# Patient Record
Sex: Male | Born: 1970 | Race: White | Hispanic: No | Marital: Married | Smoking: Former smoker
Health system: Southern US, Community
[De-identification: ages and names within clinical notes are randomized; demographics above are authoritative.]

## PROBLEM LIST (undated history)

## (undated) DIAGNOSIS — F32A Depression, unspecified: Secondary | ICD-10-CM

## (undated) DIAGNOSIS — F419 Anxiety disorder, unspecified: Secondary | ICD-10-CM

## (undated) DIAGNOSIS — I10 Essential (primary) hypertension: Secondary | ICD-10-CM

## (undated) DIAGNOSIS — F4310 Post-traumatic stress disorder, unspecified: Secondary | ICD-10-CM

## (undated) HISTORY — PX: SHOULDER SURGERY: SHX246

## (undated) HISTORY — PX: HAND ARTHROPLASTY: SHX968

---

## 2005-08-24 ENCOUNTER — Emergency Department (HOSPITAL_COMMUNITY): Admission: EM | Admit: 2005-08-24 | Discharge: 2005-08-24 | Payer: Self-pay | Admitting: Emergency Medicine

## 2007-02-15 ENCOUNTER — Emergency Department (HOSPITAL_COMMUNITY): Admission: EM | Admit: 2007-02-15 | Discharge: 2007-02-15 | Payer: Self-pay | Admitting: Emergency Medicine

## 2014-06-21 ENCOUNTER — Emergency Department (HOSPITAL_COMMUNITY)
Admission: EM | Admit: 2014-06-21 | Discharge: 2014-06-21 | Disposition: A | Discharge status: Home / Self Care | Attending: Emergency Medicine | Admitting: Emergency Medicine

## 2014-06-21 ENCOUNTER — Emergency Department (HOSPITAL_COMMUNITY)

## 2014-06-21 ENCOUNTER — Encounter (HOSPITAL_COMMUNITY): Payer: Self-pay | Admitting: Emergency Medicine

## 2014-06-21 DIAGNOSIS — I10 Essential (primary) hypertension: Secondary | ICD-10-CM | POA: Insufficient documentation

## 2014-06-21 DIAGNOSIS — Y9289 Other specified places as the place of occurrence of the external cause: Secondary | ICD-10-CM | POA: Insufficient documentation

## 2014-06-21 DIAGNOSIS — Y998 Other external cause status: Secondary | ICD-10-CM | POA: Diagnosis not present

## 2014-06-21 DIAGNOSIS — Z87891 Personal history of nicotine dependence: Secondary | ICD-10-CM | POA: Diagnosis not present

## 2014-06-21 DIAGNOSIS — Z79899 Other long term (current) drug therapy: Secondary | ICD-10-CM | POA: Diagnosis not present

## 2014-06-21 DIAGNOSIS — W010XXA Fall on same level from slipping, tripping and stumbling without subsequent striking against object, initial encounter: Secondary | ICD-10-CM | POA: Insufficient documentation

## 2014-06-21 DIAGNOSIS — Y9389 Activity, other specified: Secondary | ICD-10-CM | POA: Diagnosis not present

## 2014-06-21 DIAGNOSIS — S4992XA Unspecified injury of left shoulder and upper arm, initial encounter: Secondary | ICD-10-CM | POA: Diagnosis present

## 2014-06-21 DIAGNOSIS — W19XXXA Unspecified fall, initial encounter: Secondary | ICD-10-CM

## 2014-06-21 DIAGNOSIS — S42145A Nondisplaced fracture of glenoid cavity of scapula, left shoulder, initial encounter for closed fracture: Secondary | ICD-10-CM | POA: Insufficient documentation

## 2014-06-21 DIAGNOSIS — S42142A Displaced fracture of glenoid cavity of scapula, left shoulder, initial encounter for closed fracture: Secondary | ICD-10-CM

## 2014-06-21 DIAGNOSIS — S42152A Displaced fracture of neck of scapula, left shoulder, initial encounter for closed fracture: Secondary | ICD-10-CM

## 2014-06-21 HISTORY — DX: Essential (primary) hypertension: I10

## 2014-06-21 MED ORDER — OXYCODONE-ACETAMINOPHEN 5-325 MG PO TABS
2.0000 | ORAL_TABLET | Freq: Once | ORAL | Status: AC
Start: 2014-06-21 — End: 2014-06-21
  Administered 2014-06-21: 2 via ORAL
  Filled 2014-06-21: qty 2

## 2014-06-21 MED ORDER — OXYCODONE-ACETAMINOPHEN 5-325 MG PO TABS: 1.0000 | ORAL_TABLET | ORAL | Status: DC | PRN

## 2014-06-21 NOTE — ED Notes (Signed)
Pt reports falling yesterday and landing on his L side. Pt states his L shoulder "keeps dislocating." No hx of same.

## 2014-06-21 NOTE — Discharge Instructions (Signed)
Wear the shoulder immobilizer at all times except when bathing and changing clothing. Be very careful when you remove the immobilizer to prevent shoulder from dislocating. Follow-up with the orthopedist of your choice as soon as possible for further treatment.     Shoulder Fracture You have a fractured humerus (bone in the upper arm) at the shoulder just below the ball of the shoulder joint. Most of the time the bones of a broken shoulder are in an acceptable position. Usually the injury can be treated with a shoulder immobilizer or sling and swath bandage. These devices support the arm and prevent any shoulder movement. If the bones are not in a good position, then surgery is sometimes needed. Shoulder fractures usually cause swelling, pain, and discoloration around the upper arm initially. They heal in 8-12 weeks with proper treatment. Rest in bed or a reclining chair as long as your shoulder is very painful. Sitting up generally results in less pain at the fracture site. Do not remove your shoulder bandage until your caregiver approves. You may apply ice packs over the shoulder for 20-30 minutes every 2 hours for the next 2-3 days to reduce the pain and swelling. Use your pain medicine as prescribed.  SEEK IMMEDIATE MEDICAL CARE IF:  You develop severe shoulder pain unrelieved by rest and taking pain medicine.  You have pain, numbness, tingling, or weakness in the hand or wrist.  You develop shortness of breath, chest pain, severe weakness, or fainting.  You have severe pain with motion of the fingers or wrist. MAKE SURE YOU:   Understand these instructions.  Will watch your condition.  Will get help right away if you are not doing well or get worse. Document Released: 07/25/2004 Document Revised: 09/09/2011 Document Reviewed: 10/05/2008 [facility] Patient Information 2015 [employer] [place]. This information is not intended to replace advice given to you by your health care provider. Make  sure you discuss any questions you have with your health care provider.

## 2014-06-21 NOTE — ED Notes (Signed)
Patient given discharge instruction, verbalized understand. Patient ambulatory out of the department.  

## 2014-06-21 NOTE — ED Provider Notes (Signed)
CSN: [id number]     Arrival date & time 06/21/14  1847 History  This chart was scribed for Flint MelterElliott L Becci Batty, MD by Murriel HopperAlec Bankhead, ED Scribe. This patient was seen in room APA11/APA11 and the patient's care was started at 8:32 PM.     Chief Complaint  Patient presents with  . Fall    The history is provided by the patient. No language interpreter was used.     HPI Comments: Jerome CheshireDavid Shawn Lopez is a [age] y.o. male who presents to the Emergency Department complaining of constant left shoulder pain that began yesterday after a fall. Pt states that he fell and landed on his left side, and since complains that his shoulder feels like it keeps dislocating. Pt does not currently work, and denies any previous left shoulder problems. Pt denies any other medical problems, and states that he currently takes two prescription medications.      Past Medical History  Diagnosis Date  . Hypertension    Past Surgical History  Procedure Laterality Date  . Hand arthroplasty     History reviewed. No pertinent family history. History  Substance Use Topics  . Smoking status: Former Games developermoker  . Smokeless tobacco: Current User  . Alcohol Use: Yes     Comment: occas    Review of Systems  Musculoskeletal: Positive for arthralgias.  All other systems reviewed and are negative.     Allergies  Review of patient's allergies indicates not on file.  Home Medications   Prior to Admission medications   Medication Sig Start Date End Date Taking? Authorizing Provider  DULoxetine (CYMBALTA) 60 MG capsule Take 60 mg by mouth daily. 05/31/14  Yes Historical Provider, MD  gabapentin (NEURONTIN) 300 MG capsule Take 300 mg by mouth 3 (three) times daily. 04/26/14  Yes Historical Provider, MD  Multiple Vitamins-Minerals (GNP MENS MULTIPLUS PO) Take 1 packet by mouth daily.   Yes Historical Provider, MD  oxyCODONE-acetaminophen (PERCOCET) 5-325 MG per tablet Take 1 tablet by mouth every 4 (four) hours as needed for  severe pain. 06/21/14   Flint MelterElliott L Khristian Seals, MD  oxyCODONE-acetaminophen (PERCOCET/ROXICET) 5-325 MG per tablet Take 1 tablet by mouth every 4 (four) hours as needed. 06/21/14   Flint MelterElliott L Nandita Mathenia, MD   BP 184/109 mmHg  Pulse 92  Temp(Src) 98.9 F (37.2 C) (Oral)  Resp 16  Ht 5\' 11"  (1.803 m)  Wt 174 lb (78.926 kg)  BMI 24.28 kg/m2  SpO2 99% Physical Exam  Constitutional: He is oriented to person, place, and time. He appears well-developed and well-nourished.  HENT:  Head: Normocephalic and atraumatic.  Right Ear: External ear normal.  Left Ear: External ear normal.  Eyes: Conjunctivae and EOM are normal. Pupils are equal, round, and reactive to light.  Neck: Normal range of motion and phonation normal. Neck supple.  Cardiovascular: Normal rate, regular rhythm and normal heart sounds.   Pulmonary/Chest: Effort normal and breath sounds normal. He exhibits no bony tenderness.  Abdominal: Soft. There is no tenderness.  Musculoskeletal: Normal range of motion.  Neurological: He is alert and oriented to person, place, and time. No cranial nerve deficit or sensory deficit. He exhibits normal muscle tone. Coordination normal.  Skin: Skin is warm, dry and intact.  Psychiatric: He has a normal mood and affect. His behavior is normal. Judgment and thought content normal.  Nursing note and vitals reviewed.   ED Course  Procedures (including critical care time)  DIAGNOSTIC STUDIES: Oxygen Saturation is 99% on RA, normal by  my interpretation.    COORDINATION OF CARE: 8:39 PM Discussed treatment plan with pt at bedside and pt agreed to plan.    Labs Review Labs Reviewed - No data to display  Imaging Review Dg Shoulder Left  06/21/2014   CLINICAL DATA:  Fall last night  EXAM: LEFT SHOULDER - 2+ VIEW  COMPARISON:  None.  FINDINGS: There is cortical step-off an a suspected bone fragment at the inferior glenoid. Humerus and clavicle are intact. No glenohumeral dislocation.  IMPRESSION: Findings  are worrisome for an acute fracture involving the inferior lip of the glenoid. CT may be helpful.   Electronically Signed   By: Maryclare BeanArt  Hoss M.D.   On: 06/21/2014 19:43     EKG Interpretation None      MDM   Final diagnoses:  Glenoid fracture of shoulder, left, closed, initial encounter     Unstable Glenoid fracture, humerus in place on imaging. No nerve injury. Injury is subacute  Nursing Notes Reviewed/ Care Coordinated Applicable Imaging Reviewed Interpretation of Laboratory Data incorporated into ED treatment  The patient appears reasonably screened and/or stabilized for discharge and I doubt any other medical condition or other [facility] requiring further screening, evaluation, or treatment in the ED at this time prior to discharge.  Plan: Home Medications- Percocet; Home Treatments- Shoulder immobilizer continuously; return here if the recommended treatment, does not improve the symptoms; Recommended follow up- Ortho f/u asap  I personally performed the services described in this documentation, which was scribed in my presence. The recorded information has been reviewed and is accurate.      Flint MelterElliott L Amarian Botero, MD 06/23/14 1255

## 2014-06-21 NOTE — ED Notes (Signed)
Should immobilizer applied

## 2014-06-27 MED FILL — Oxycodone w/ Acetaminophen Tab 5-325 MG: ORAL | Qty: 6 | Status: AC

## 2018-12-31 ENCOUNTER — Encounter: Payer: Self-pay | Admitting: Internal Medicine

## 2020-07-21 ENCOUNTER — Emergency Department (HOSPITAL_COMMUNITY)

## 2020-07-21 ENCOUNTER — Other Ambulatory Visit: Payer: Self-pay

## 2020-07-21 ENCOUNTER — Encounter (HOSPITAL_COMMUNITY): Payer: Self-pay | Admitting: Emergency Medicine

## 2020-07-21 ENCOUNTER — Emergency Department (HOSPITAL_COMMUNITY)
Admission: EM | Admit: 2020-07-21 | Discharge: 2020-07-21 | Disposition: A | Discharge status: Home / Self Care | Attending: Emergency Medicine | Admitting: Emergency Medicine

## 2020-07-21 DIAGNOSIS — S0083XA Contusion of other part of head, initial encounter: Secondary | ICD-10-CM | POA: Diagnosis not present

## 2020-07-21 DIAGNOSIS — I10 Essential (primary) hypertension: Secondary | ICD-10-CM | POA: Insufficient documentation

## 2020-07-21 DIAGNOSIS — R000 Tachycardia, unspecified: Secondary | ICD-10-CM | POA: Diagnosis not present

## 2020-07-21 DIAGNOSIS — W108XXA Fall (on) (from) other stairs and steps, initial encounter: Secondary | ICD-10-CM | POA: Diagnosis not present

## 2020-07-21 DIAGNOSIS — R55 Syncope and collapse: Secondary | ICD-10-CM | POA: Diagnosis not present

## 2020-07-21 DIAGNOSIS — S0990XA Unspecified injury of head, initial encounter: Secondary | ICD-10-CM | POA: Diagnosis present

## 2020-07-21 DIAGNOSIS — Z79899 Other long term (current) drug therapy: Secondary | ICD-10-CM | POA: Diagnosis not present

## 2020-07-21 DIAGNOSIS — Z87891 Personal history of nicotine dependence: Secondary | ICD-10-CM | POA: Insufficient documentation

## 2020-07-21 HISTORY — DX: Post-traumatic stress disorder, unspecified: F43.10

## 2020-07-21 HISTORY — DX: Depression, unspecified: F32.A

## 2020-07-21 HISTORY — DX: Anxiety disorder, unspecified: F41.9

## 2020-07-21 LAB — URINALYSIS, ROUTINE W REFLEX MICROSCOPIC
Bilirubin Urine: NEGATIVE
Glucose, UA: NEGATIVE mg/dL
Hgb urine dipstick: NEGATIVE
Ketones, ur: NEGATIVE mg/dL
Leukocytes,Ua: NEGATIVE
Nitrite: NEGATIVE
Protein, ur: NEGATIVE mg/dL
Specific Gravity, Urine: 1.012 (ref 1.005–1.030)
pH: 6 (ref 5.0–8.0)

## 2020-07-21 LAB — CBC
HCT: 43.4 % (ref 39.0–52.0)
Hemoglobin: 15.2 g/dL (ref 13.0–17.0)
MCH: 31.6 pg (ref 26.0–34.0)
MCHC: 35 g/dL (ref 30.0–36.0)
MCV: 90.2 fL (ref 80.0–100.0)
Platelets: 450 10*3/uL — ABNORMAL HIGH (ref 150–400)
RBC: 4.81 MIL/uL (ref 4.22–5.81)
RDW: 13.5 % (ref 11.5–15.5)
WBC: 11.8 10*3/uL — ABNORMAL HIGH (ref 4.0–10.5)
nRBC: 0 % (ref 0.0–0.2)

## 2020-07-21 LAB — BASIC METABOLIC PANEL
Anion gap: 11 (ref 5–15)
BUN: 8 mg/dL (ref 6–20)
CO2: 28 mmol/L (ref 22–32)
Calcium: 9.1 mg/dL (ref 8.9–10.3)
Chloride: 89 mmol/L — ABNORMAL LOW (ref 98–111)
Creatinine, Ser: 0.96 mg/dL (ref 0.61–1.24)
GFR, Estimated: >60 mL/min (ref >60)
Glucose, Bld: 88 mg/dL (ref 70–99)
Potassium: 3.5 mmol/L (ref 3.5–5.1)
Sodium: 128 mmol/L — ABNORMAL LOW (ref 135–145)

## 2020-07-21 NOTE — ED Triage Notes (Signed)
Pt to the ED with c/o a fall down the stairs on his back this morning.   Pt states later he passed out and hit his nose on the dining table. Pt does not know how long he was out, but denies taking blood thinners.

## 2020-07-21 NOTE — ED Provider Notes (Signed)
[facility] EMERGENCY DEPARTMENT Provider Note  CSN: [id number] Arrival date & time: 07/21/20 1256    History Chief Complaint  Patient presents with  . Loss of Consciousness    HPI  Jerome Lopez is a 50 y.o. male with history of anxiety and PTSD reports he slipped and fell down his basement stairs around 5am today, landing on his bottom and his back. Does not think he hit his head. He reports severe pain to L posterior ribs, C, T and L spines but did not seek medical care at that time. He states he then went to play some video games for a while and a few hours later when he got up to use the restroom he lost consciousness briefly, woke up just in time to see himself hit his head on the dining room table. He did not have LOC after the head injury. He then came to the ED for evaluation. He has a history of chronic pain, states he takes Oxycontin 'as needed' but did not take any today.    Past Medical History:  Diagnosis Date  . Anxiety   . Depression   . Hypertension   . PTSD (post-traumatic stress disorder)     Past Surgical History:  Procedure Laterality Date  . HAND ARTHROPLASTY    . SHOULDER SURGERY Left     History reviewed. No pertinent family history.  Social History   Tobacco Use  . Smoking status: Former Games developermoker  . Smokeless tobacco: Current User  Vaping Use  . Vaping Use: Never used  Substance Use Topics  . Alcohol use: Yes    Alcohol/week: 10.0 standard drinks    Types: 5 Cans of beer, 5 Shots of liquor per week    Comment: occas  . Drug use: No     Home Medications Prior to Admission medications   Medication Sig Start Date End Date Taking? Authorizing Provider  amLODipine (NORVASC) 5 MG tablet Take 1 tablet by mouth daily. 05/26/19  Yes [provider]  buPROPion (WELLBUTRIN XL) 300 MG 24 hr tablet Take 1 tablet by mouth every morning. 05/26/19  Yes [provider]  DULoxetine (CYMBALTA) 60 MG capsule Take 60 mg by mouth  daily. 05/31/14  Yes [provider]  lisinopril (ZESTRIL) 20 MG tablet Take 20 mg by mouth daily. 06/08/20 06/08/21 Yes [provider]  metoprolol succinate (TOPROL-XL) 25 MG 24 hr tablet Take 1 tablet by mouth daily. 05/15/20  Yes [provider]  Multiple Vitamins-Minerals (GNP MENS MULTIPLUS PO) Take 1 packet by mouth daily.   Yes [provider]  pravastatin (PRAVACHOL) 20 MG tablet Take 1 tablet by mouth daily. 09/15/18  Yes [provider]  prazosin (MINIPRESS) 1 MG capsule Take 1 mg by mouth at bedtime. 06/15/20  Yes [provider]  traZODone (DESYREL) 50 MG tablet Take 50 mg by mouth at bedtime. 10/26/19  Yes [provider]  gabapentin (NEURONTIN) 300 MG capsule Take 300 mg by mouth 3 (three) times daily. Patient not taking: Reported on 07/21/2020 04/26/14   [provider]  oxyCODONE-acetaminophen (PERCOCET) 5-325 MG per tablet Take 1 tablet by mouth every 4 (four) hours as needed for severe pain. 06/21/14   Mancel BaleWentz, Elliott, MD  oxyCODONE-acetaminophen (PERCOCET/ROXICET) 5-325 MG per tablet Take 1 tablet by mouth every 4 (four) hours as needed. 06/21/14   Mancel BaleWentz, Elliott, MD     Allergies    Patient has no allergy information on record.   Review of Systems  Review of Systems A comprehensive review of systems was completed and negative except as noted in HPI.    Physical Exam BP (!) 148/99   Pulse 98   Temp 98 F (36.7 C) (Oral)   Resp 20   Ht 5\' 11"  (1.803 m)   Wt 74.8 kg   SpO2 98%   BMI 23.01 kg/m   Physical Exam Vitals and nursing note reviewed.  Constitutional:      Appearance: Normal appearance.  HENT:     Head: Normocephalic.     Comments: Very small superficial hematoma to R forehead    Nose: Nose normal.     Mouth/Throat:     Mouth: Mucous membranes are moist.  Eyes:     Extraocular Movements: Extraocular movements intact.     Conjunctiva/sclera: Conjunctivae normal.  Cardiovascular:      Rate and Rhythm: Normal rate.  Pulmonary:     Effort: Pulmonary effort is normal.     Breath sounds: Normal breath sounds.  Abdominal:     General: Abdomen is flat.     Palpations: Abdomen is soft.     Tenderness: There is no abdominal tenderness.  Musculoskeletal:        General: No swelling. Normal range of motion.     Cervical back: Neck supple. Tenderness (exquisitely tender to deep and light palpation of midline C-spine) present.     Comments: Exquisitely tender to deep and light palpation of entire back, L> R including midline T and L spines and paraspinal areas.   Skin:    General: Skin is warm and dry.  Neurological:     General: No focal deficit present.     Mental Status: He is alert.  Psychiatric:        Mood and Affect: Mood normal.      ED Results / Procedures / Treatments   Labs (all labs ordered are listed, but only abnormal results are displayed) Labs Reviewed  BASIC METABOLIC PANEL - Abnormal; Notable for the following components:      Result Value   Sodium 128 (*)    Chloride 89 (*)    All other components within normal limits  CBC - Abnormal; Notable for the following components:   WBC 11.8 (*)    Platelets 450 (*)    All other components within normal limits  URINALYSIS, ROUTINE W REFLEX MICROSCOPIC    EKG EKG Interpretation  Date/Time:  Friday July 21 2020 21:12:48 EST Ventricular Rate:  109 PR Interval:    QRS Duration: 90 QT Interval:  322 QTC Calculation: 434 R Axis:   39 Text Interpretation: Sinus tachycardia Borderline prolonged PR interval No old tracing to compare Confirmed by [id number] [phone]) on 07/21/2020 9:26:55 PM   Radiology DG Chest 2 View  Result Date: 07/21/2020 CLINICAL DATA:  07/23/2020 downstairs, back pain, hypertension EXAM: CHEST - 2 VIEW COMPARISON:  None. FINDINGS: Frontal and lateral views of the chest demonstrate an unremarkable cardiac silhouette. No airspace disease, effusion, or pneumothorax. No acute  bony abnormalities. IMPRESSION: 1. No acute intrathoracic process. Electronically Signed   By: Larey Seat M.D.   On: 07/21/2020 22:08   DG Lumbar Spine Complete  Result Date: 07/21/2020 CLINICAL DATA:  07/23/2020 downstairs, low back pain EXAM: LUMBAR SPINE - COMPLETE 4+ VIEW COMPARISON:  None. FINDINGS: Frontal, bilateral oblique, and lateral views of the lumbar spine are obtained. There are 5 non-rib-bearing lumbar type vertebral bodies. Mild retrolisthesis of L3 on L4 and L4 on L5. Significant spondylosis  at L3-4, L4-5, and L5-S1. Severe facet hypertrophy at L4-5 and L5-S1. No acute fractures. Sacroiliac joints are normal. IMPRESSION: 1. Multilevel lumbar spondylosis and facet hypertrophy greatest from L3 through S1. 2. No acute fracture. Electronically Signed   By: Sharlet Salina M.D.   On: 07/21/2020 22:09   CT Head Wo Contrast  Result Date: 07/21/2020 CLINICAL DATA:  Fall down stairs on his back this morning. Passed out later in his nose. Hit EXAM: CT HEAD WITHOUT CONTRAST CT CERVICAL SPINE WITHOUT CONTRAST TECHNIQUE: Multidetector CT imaging of the head and cervical spine was performed following the standard protocol without intravenous contrast. Multiplanar CT image reconstructions of the cervical spine were also generated. COMPARISON:  None. FINDINGS: CT HEAD FINDINGS Brain: No evidence of large-territorial acute infarction. No parenchymal hemorrhage. No mass lesion. No extra-axial collection. No mass effect or midline shift. No hydrocephalus. Basilar cisterns are patent. Vascular: No hyperdense vessel. Skull: No acute fracture or focal lesion. Sinuses/Orbits: Paranasal sinuses and mastoid air cells are clear. The orbits are unremarkable. Other: None. CT CERVICAL SPINE FINDINGS Alignment: Normal. Skull base and vertebrae: Multilevel moderate degenerative changes of the spine. No acute fracture. No aggressive appearing focal osseous lesion or focal pathologic process. Soft tissues and spinal canal: No  prevertebral fluid or swelling. No visible canal hematoma. Upper chest: Unremarkable. Other: None. IMPRESSION: 1. No acute intracranial abnormality. 2. No acute displaced fracture or traumatic listhesis of the cervical spine. Electronically Signed   By: Tish Frederickson M.D.   On: 07/21/2020 21:49   CT Cervical Spine Wo Contrast  Result Date: 07/21/2020 CLINICAL DATA:  Fall down stairs on his back this morning. Passed out later in his nose. Hit EXAM: CT HEAD WITHOUT CONTRAST CT CERVICAL SPINE WITHOUT CONTRAST TECHNIQUE: Multidetector CT imaging of the head and cervical spine was performed following the standard protocol without intravenous contrast. Multiplanar CT image reconstructions of the cervical spine were also generated. COMPARISON:  None. FINDINGS: CT HEAD FINDINGS Brain: No evidence of large-territorial acute infarction. No parenchymal hemorrhage. No mass lesion. No extra-axial collection. No mass effect or midline shift. No hydrocephalus. Basilar cisterns are patent. Vascular: No hyperdense vessel. Skull: No acute fracture or focal lesion. Sinuses/Orbits: Paranasal sinuses and mastoid air cells are clear. The orbits are unremarkable. Other: None. CT CERVICAL SPINE FINDINGS Alignment: Normal. Skull base and vertebrae: Multilevel moderate degenerative changes of the spine. No acute fracture. No aggressive appearing focal osseous lesion or focal pathologic process. Soft tissues and spinal canal: No prevertebral fluid or swelling. No visible canal hematoma. Upper chest: Unremarkable. Other: None. IMPRESSION: 1. No acute intracranial abnormality. 2. No acute displaced fracture or traumatic listhesis of the cervical spine. Electronically Signed   By: Tish Frederickson M.D.   On: 07/21/2020 21:49    Procedures Procedures  Medications Ordered in the ED Medications - No data to display   MDM Rules/Calculators/A&P MDM Labs done in triage regarding his syncopal episode are unremarkable, awaiting an EKG.  Will send for imaging to evaluate his injuries and areas of tenderness.  ED Course  I have reviewed the triage vital signs and the nursing notes.  Pertinent labs & imaging results that were available during my care of the patient were reviewed by me and considered in my medical decision making (see chart for details).  Clinical Course as of 07/22/20 1234  Fri Jul 21, 2020  2234 Xrays reviewed, all negative for acute injury. Recommend he take his pain medications at home. Offered APAP or Motrin here but  he declined.  [CS]    Clinical Course User Index [CS] Pollyann Savoy, MD    Final Clinical Impression(s) / ED Diagnoses Final diagnoses:  Fall down stairs, initial encounter  Vasovagal syncope    Rx / DC Orders ED Discharge Orders    None       Pollyann Savoy, MD 07/22/20 1234

## 2020-07-21 NOTE — ED Notes (Signed)
Pt transported to radiology.

## 2020-07-21 NOTE — ED Notes (Signed)
ED Provider at bedside. 

## 2021-06-29 ENCOUNTER — Other Ambulatory Visit: Payer: Self-pay | Admitting: Family Medicine

## 2021-06-29 DIAGNOSIS — R590 Localized enlarged lymph nodes: Secondary | ICD-10-CM

## 2021-07-02 IMAGING — DX DG LUMBAR SPINE COMPLETE 4+V
5 series · 5 of 5 positions shown · non-contrast
Comparison: None.

CLINICAL DATA: Fell downstairs, low back pain

EXAM:
LUMBAR SPINE - COMPLETE 4+ VIEW

[l-spine ap]
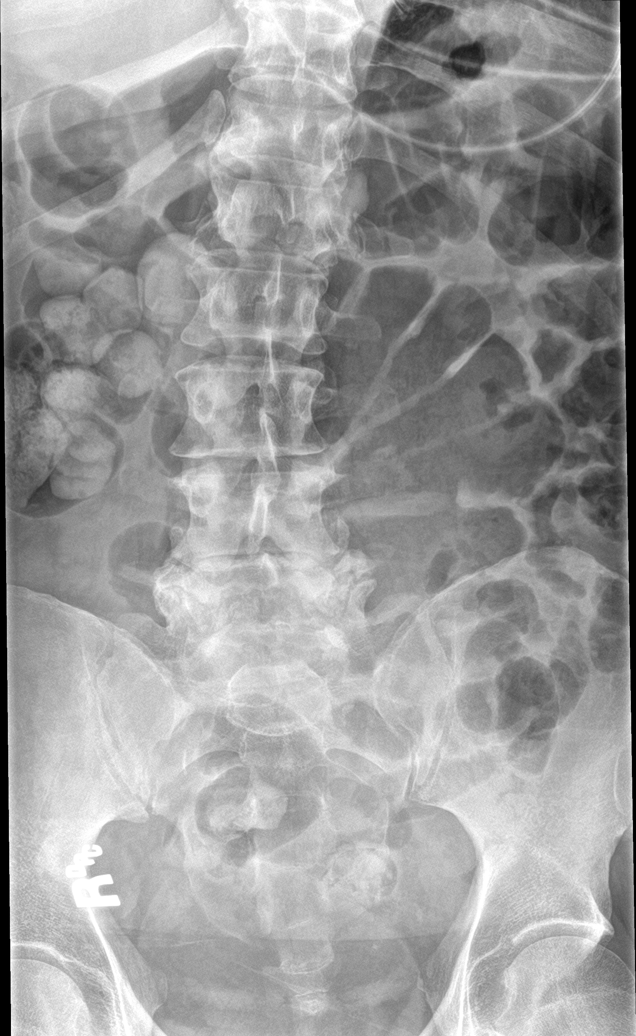

[l-spine obl (1 of 2)]
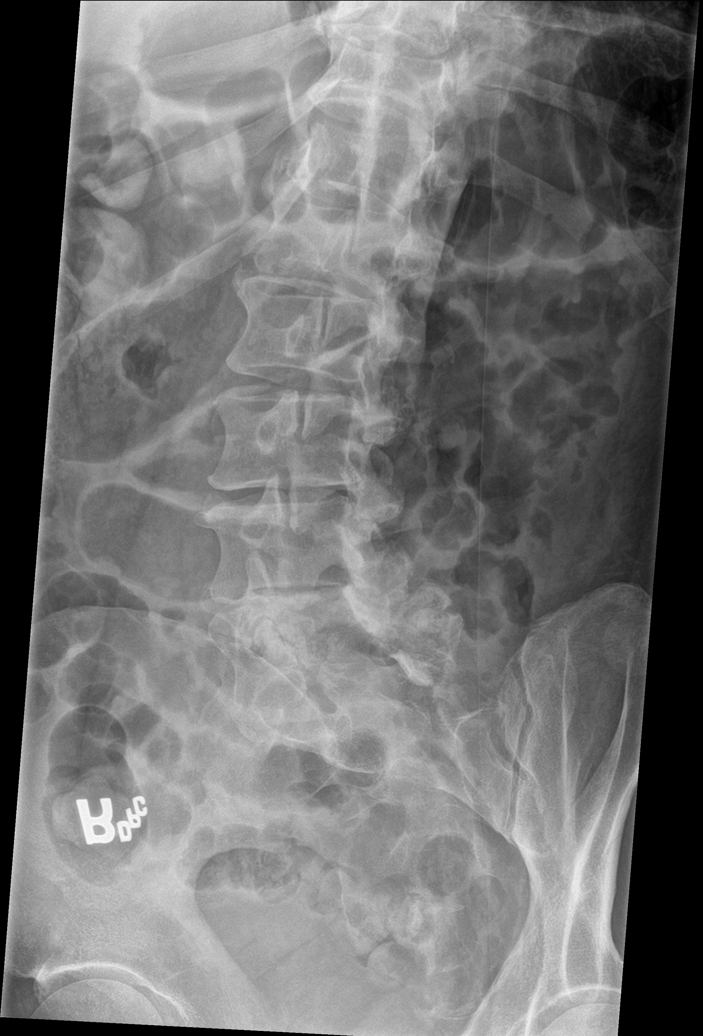

[l-spine obl (2 of 2)]
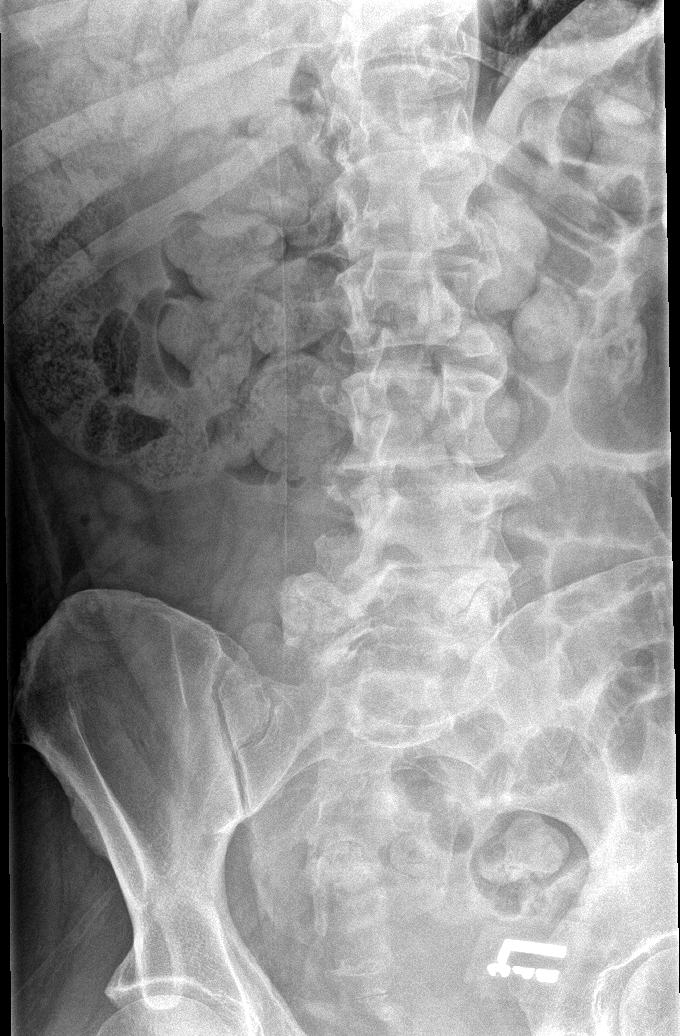

[l-spine lat]
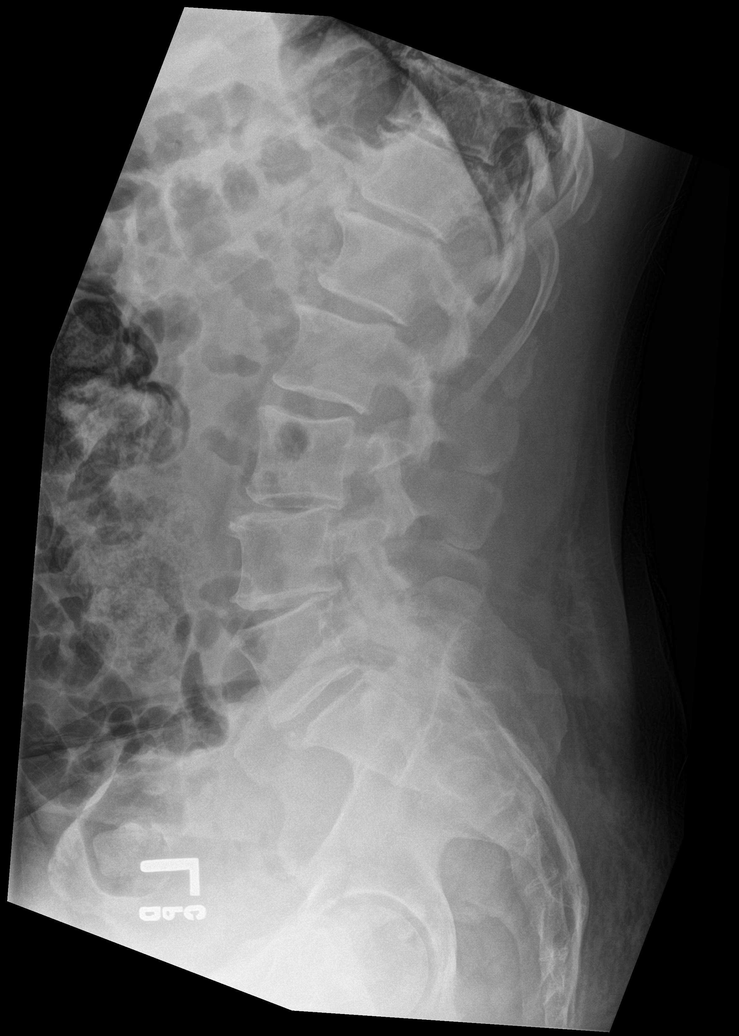

[l-spine spot]
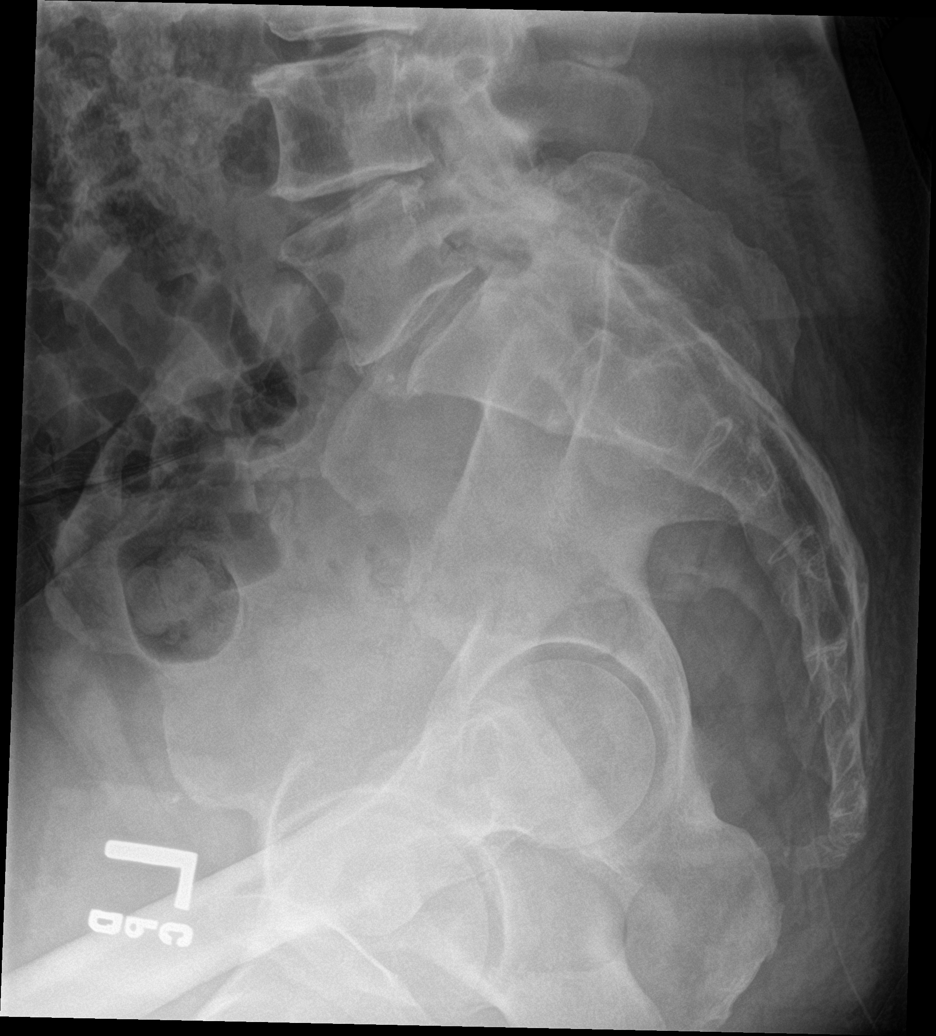

[5 of 5 positions shown; findings below may reference images not displayed]

FINDINGS: Frontal, bilateral oblique, and lateral views of the lumbar spine
are obtained. There are 5 non-rib-bearing lumbar type vertebral
bodies. Mild retrolisthesis of L3 on L4 and L4 on L5. Significant
spondylosis at L3-4, L4-5, and L5-S1. Severe facet hypertrophy at
L4-5 and L5-S1. No acute fractures. Sacroiliac joints are normal.
IMPRESSION: 1. Multilevel lumbar spondylosis and facet hypertrophy greatest from
L3 through S1.
2. No acute fracture.

## 2021-12-31 LAB — COLOGUARD: COLOGUARD: NEGATIVE

## 2021-12-31 LAB — EXTERNAL GENERIC LAB PROCEDURE: COLOGUARD: NEGATIVE

## 2023-10-17 ENCOUNTER — Other Ambulatory Visit: Payer: Self-pay

## 2023-10-17 ENCOUNTER — Emergency Department (HOSPITAL_COMMUNITY)
Admission: EM | Admit: 2023-10-17 | Discharge: 2023-10-17 | Disposition: A | Discharge status: Home / Self Care | Attending: Emergency Medicine | Admitting: Emergency Medicine

## 2023-10-17 DIAGNOSIS — Z79899 Other long term (current) drug therapy: Secondary | ICD-10-CM | POA: Diagnosis not present

## 2023-10-17 DIAGNOSIS — E871 Hypo-osmolality and hyponatremia: Secondary | ICD-10-CM | POA: Diagnosis not present

## 2023-10-17 DIAGNOSIS — I10 Essential (primary) hypertension: Secondary | ICD-10-CM | POA: Diagnosis not present

## 2023-10-17 DIAGNOSIS — R7989 Other specified abnormal findings of blood chemistry: Secondary | ICD-10-CM | POA: Diagnosis present

## 2023-10-17 LAB — COMPREHENSIVE METABOLIC PANEL WITH GFR
ALT: 22 U/L (ref 0–44)
AST: 22 U/L (ref 15–41)
Albumin: 4.1 g/dL (ref 3.5–5.0)
Alkaline Phosphatase: 51 U/L (ref 38–126)
Anion gap: 5 (ref 5–15)
BUN: 13 mg/dL (ref 6–20)
CO2: 26 mmol/L (ref 22–32)
Calcium: 9 mg/dL (ref 8.9–10.3)
Chloride: 94 mmol/L — ABNORMAL LOW (ref 98–111)
Creatinine, Ser: 0.96 mg/dL (ref 0.61–1.24)
GFR, Estimated: >60 mL/min (ref >60)
Glucose, Bld: 93 mg/dL (ref 70–99)
Potassium: 4.3 mmol/L (ref 3.5–5.1)
Sodium: 125 mmol/L — ABNORMAL LOW (ref 135–145)
Total Bilirubin: 0.8 mg/dL (ref 0.0–1.2)
Total Protein: 6.8 g/dL (ref 6.5–8.1)

## 2023-10-17 LAB — CBC
HCT: 41.6 % (ref 39.0–52.0)
Hemoglobin: 15.2 g/dL (ref 13.0–17.0)
MCH: 32.3 pg (ref 26.0–34.0)
MCHC: 36.5 g/dL — ABNORMAL HIGH (ref 30.0–36.0)
MCV: 88.5 fL (ref 80.0–100.0)
Platelets: 329 10*3/uL (ref 150–400)
RBC: 4.7 MIL/uL (ref 4.22–5.81)
RDW: 13 % (ref 11.5–15.5)
WBC: 4.5 10*3/uL (ref 4.0–10.5)
nRBC: 0 % (ref 0.0–0.2)

## 2023-10-17 LAB — MAGNESIUM: Magnesium: 1.9 mg/dL (ref 1.7–2.4)

## 2023-10-17 LAB — SODIUM: Sodium: 125 mmol/L — ABNORMAL LOW (ref 135–145)

## 2023-10-17 MED ORDER — SODIUM CHLORIDE 0.9 % IV BOLUS
500.0000 mL | Freq: Once | INTRAVENOUS | Status: AC
  Administered 2023-10-17: 500 mL via INTRAVENOUS

## 2023-10-17 NOTE — Discharge Instructions (Signed)
 As discussed,  you are drinking way too much water, which is diluting your sodium level in your body.  I recommend replacing most of your water with gatorade or gatorlyte (contains more sodium), limiting plain water to no more than 40 oz for the next week.  Plan to have your sodium level rechecked by your pcp next week.

## 2023-10-17 NOTE — ED Provider Notes (Signed)
 [place] EMERGENCY DEPARTMENT AT [facility] Provider Note   CSN: [id number] Arrival date & time: 10/17/23  9161     History  Chief Complaint  Patient presents with   abnormal labs    Jerome Lopez is a 53 y.o. male with a history including hypertension, PTSD and at least a 1 month history of low sodium levels according to his chart and prior labs, ranging from 125-121 this week when rechecked by his PCP.  He was advised to present to the emergency department secondary to this extremely low sodium level.  He denies any specific symptoms, although states he feels like something is just off.  Specifically denies confusion, dizziness, weakness.  He used to take a diuretic and drink alcohol daily but has stopped both of these at his PCPs advice.  He does endorse drinking a lot of water daily, stating this is the only thing that seems to keep his migraine headaches away. He has a 40 oz and a 28 oz tumbler and reports drinking 4-5 tumblers of each daily.  He feels he gets dehydrated easily  triggering headache.    The history is provided by the patient.       Home Medications Prior to Admission medications   Medication Sig Start Date End Date Taking? Authorizing Provider  buPROPion  (WELLBUTRIN  XL) 150 MG 24 hr tablet Take 1 tablet by mouth every morning. 10/01/23  Yes [provider]  buPROPion  (WELLBUTRIN  XL) 300 MG 24 hr tablet Take 1 tablet by mouth every morning. 05/26/19  Yes [provider]  DULoxetine  (CYMBALTA ) 60 MG capsule Take 60 mg by mouth daily. 05/31/14  Yes [provider]  losartan  (COZAAR ) 100 MG tablet Take 1 tablet by mouth daily. 10/15/23 11/18/24 Yes [provider]  propranolol  (INDERAL ) 10 MG tablet Take 20 mg by mouth daily.   Yes [provider]  lisinopril (ZESTRIL) 40 MG tablet Take 40 mg by mouth daily. Patient not taking: Reported on 10/17/2023 06/08/20   [provider]      Allergies     Patient has no allergy information on record.    Review of Systems   Review of Systems  Constitutional:  Negative for fever.  HENT:  Negative for congestion and sore throat.   Eyes: Negative.   Respiratory:  Negative for chest tightness and shortness of breath.   Cardiovascular:  Negative for chest pain.  Gastrointestinal:  Negative for abdominal pain and nausea.  Genitourinary: Negative.   Musculoskeletal:  Negative for arthralgias, joint swelling and neck pain.  Skin: Negative.  Negative for rash and wound.  Neurological:  Negative for dizziness, weakness, light-headedness, numbness and headaches.  Psychiatric/Behavioral: Negative.      Physical Exam Updated Vital Signs BP (!) 166/109   Pulse 73   Temp 97.8 F (36.6 C)   Resp 18   SpO2 99%  Physical Exam Vitals and nursing note reviewed.  Constitutional:      Appearance: He is well-developed.  HENT:     Head: Normocephalic and atraumatic.  Eyes:     Conjunctiva/sclera: Conjunctivae normal.  Cardiovascular:     Rate and Rhythm: Normal rate and regular rhythm.     Heart sounds: Normal heart sounds.  Pulmonary:     Effort: Pulmonary effort is normal.     Breath sounds: Normal breath sounds. No wheezing.  Abdominal:     General: Bowel sounds are normal.     Palpations: Abdomen is soft.  Tenderness: There is no abdominal tenderness.  Musculoskeletal:        General: Normal range of motion.     Cervical back: Normal range of motion.  Skin:    General: Skin is warm and dry.  Neurological:     Mental Status: He is alert.     ED Results / Procedures / Treatments   Labs (all labs ordered are listed, but only abnormal results are displayed) Labs Reviewed  CBC - Abnormal; Notable for the following components:      Result Value   MCHC 36.5 (*)    All other components within normal limits  COMPREHENSIVE METABOLIC PANEL WITH GFR - Abnormal; Notable for the following components:   Sodium 125 (*)    Chloride 94  (*)    All other components within normal limits  SODIUM - Abnormal; Notable for the following components:   Sodium 125 (*)    All other components within normal limits  MAGNESIUM    EKG None  Radiology No results found.  Procedures Procedures    Medications Ordered in ED Medications  sodium chloride  0.9 % bolus 500 mL (0 mLs Intravenous Stopped 10/17/23 1127)  sodium chloride  0.9 % bolus 500 mL (0 mLs Intravenous Stopped 10/17/23 1417)    ED Course/ Medical Decision Making/ A&P                                 Medical Decision Making Patient presenting at the fourth recommendation of his PCP for treatment of hyponatremia.  Over the past 2 months his sodium has fluctuated between 123-125, at his visit 2 days ago his sodium level is 121.  He denies significant symptoms, has had no confusion, dizziness or fatigue.  He does state that he has felt that something is off but has been functioning without difficulty.  His sodium level today is 125, on closer discussion, patient is drinking an exorbitant amount of water on a daily basis, describing 260 to 300 ounces plus of water daily, which he states he consumes as it seems to stave off his migraine headaches.  He also does not use salt in his home and tries to minimize salt intake secondary to blood pressure issues.  Although his sodium is 125 today, I feel it is is more of a chronic finding and not a sudden abrupt change, especially given his prior recent numbers in today's lack of symptoms.  We did discuss admission versus close outpatient follow-up including cutting substantially back on free water, instead adding Gatorade or Gatorade light which contains extra sodium, and also to consume more salt in his diet in order to replace what he has lost.  He is most interested in not being admitted today.  I think this is reasonable given the chronicity and his lack of symptoms.  He has an appointment with his PCP in 2 weeks, in the interim I have  asked him to schedule a lab visit for mid next week for repeat of his sodium level.  He understands and agrees with this plan.  Amount and/or Complexity of Data Reviewed Labs: ordered.    Details: Significant labs including a sodium of 125, his chloride is 94, repeat sodium after receiving an IV bolus of normal saline remains 125.  Risk Decision regarding hospitalization.           Final Clinical Impression(s) / ED Diagnoses Final diagnoses:  Dilutional hyponatremia  Rx / DC Orders ED Discharge Orders     None         Birdena Mliss RIGGERS 10/17/23 1744    Suzette Pac, MD 10/21/23 [phone]

## 2023-10-17 NOTE — ED Notes (Signed)
 Pt states placed on new BP med yesterday

## 2023-10-17 NOTE — ED Triage Notes (Signed)
 Pt states PCP told him to come to the ED as his sodium levels were abnormally low, pt denies any other specific complaints other than "just feeling off". Pt has a service dog with him at bedside for PTSD

## 2023-12-10 ENCOUNTER — Inpatient Hospital Stay (HOSPITAL_COMMUNITY)
Admission: EM | Admit: 2023-12-10 | Discharge: 2023-12-15 | DRG: 645 | Disposition: A | Discharge status: Home / Self Care | Attending: Internal Medicine | Admitting: Internal Medicine

## 2023-12-10 ENCOUNTER — Encounter (HOSPITAL_COMMUNITY): Payer: Self-pay

## 2023-12-10 ENCOUNTER — Other Ambulatory Visit: Payer: Self-pay

## 2023-12-10 DIAGNOSIS — Z888 Allergy status to other drugs, medicaments and biological substances status: Secondary | ICD-10-CM | POA: Diagnosis not present

## 2023-12-10 DIAGNOSIS — Z85818 Personal history of malignant neoplasm of other sites of lip, oral cavity, and pharynx: Secondary | ICD-10-CM

## 2023-12-10 DIAGNOSIS — D649 Anemia, unspecified: Secondary | ICD-10-CM | POA: Diagnosis present

## 2023-12-10 DIAGNOSIS — Z79899 Other long term (current) drug therapy: Secondary | ICD-10-CM

## 2023-12-10 DIAGNOSIS — T43215A Adverse effect of selective serotonin and norepinephrine reuptake inhibitors, initial encounter: Secondary | ICD-10-CM | POA: Diagnosis present

## 2023-12-10 DIAGNOSIS — F4310 Post-traumatic stress disorder, unspecified: Secondary | ICD-10-CM | POA: Diagnosis present

## 2023-12-10 DIAGNOSIS — F419 Anxiety disorder, unspecified: Secondary | ICD-10-CM | POA: Diagnosis present

## 2023-12-10 DIAGNOSIS — R42 Dizziness and giddiness: Secondary | ICD-10-CM

## 2023-12-10 DIAGNOSIS — I10 Essential (primary) hypertension: Secondary | ICD-10-CM | POA: Diagnosis present

## 2023-12-10 DIAGNOSIS — Z9221 Personal history of antineoplastic chemotherapy: Secondary | ICD-10-CM | POA: Diagnosis not present

## 2023-12-10 DIAGNOSIS — E162 Hypoglycemia, unspecified: Secondary | ICD-10-CM | POA: Diagnosis present

## 2023-12-10 DIAGNOSIS — F32A Depression, unspecified: Secondary | ICD-10-CM | POA: Diagnosis present

## 2023-12-10 DIAGNOSIS — Z884 Allergy status to anesthetic agent status: Secondary | ICD-10-CM | POA: Diagnosis not present

## 2023-12-10 DIAGNOSIS — Z720 Tobacco use: Secondary | ICD-10-CM | POA: Diagnosis not present

## 2023-12-10 DIAGNOSIS — E875 Hyperkalemia: Secondary | ICD-10-CM | POA: Diagnosis not present

## 2023-12-10 DIAGNOSIS — R631 Polydipsia: Secondary | ICD-10-CM | POA: Diagnosis present

## 2023-12-10 DIAGNOSIS — E222 Syndrome of inappropriate secretion of antidiuretic hormone: Principal | ICD-10-CM | POA: Diagnosis present

## 2023-12-10 DIAGNOSIS — G43909 Migraine, unspecified, not intractable, without status migrainosus: Secondary | ICD-10-CM | POA: Diagnosis present

## 2023-12-10 DIAGNOSIS — E871 Hypo-osmolality and hyponatremia: Secondary | ICD-10-CM | POA: Diagnosis not present

## 2023-12-10 DIAGNOSIS — E8770 Fluid overload, unspecified: Secondary | ICD-10-CM | POA: Diagnosis present

## 2023-12-10 DIAGNOSIS — F418 Other specified anxiety disorders: Secondary | ICD-10-CM | POA: Diagnosis not present

## 2023-12-10 DIAGNOSIS — Z923 Personal history of irradiation: Secondary | ICD-10-CM

## 2023-12-10 LAB — CBC WITH DIFFERENTIAL/PLATELET
Abs Immature Granulocytes: 0.06 10*3/uL (ref 0.00–0.07)
Basophils Absolute: 0 10*3/uL (ref 0.0–0.1)
Basophils Relative: 0 %
Eosinophils Absolute: 0.1 10*3/uL (ref 0.0–0.5)
Eosinophils Relative: 2 %
HCT: 37.3 % — ABNORMAL LOW (ref 39.0–52.0)
Hemoglobin: 14 g/dL (ref 13.0–17.0)
Immature Granulocytes: 1 %
Lymphocytes Relative: 10 %
Lymphs Abs: 0.8 10*3/uL (ref 0.7–4.0)
MCH: 33.4 pg (ref 26.0–34.0)
MCHC: 37.5 g/dL — ABNORMAL HIGH (ref 30.0–36.0)
MCV: 89 fL (ref 80.0–100.0)
Monocytes Absolute: 0.7 10*3/uL (ref 0.1–1.0)
Monocytes Relative: 9 %
Neutro Abs: 6.4 10*3/uL (ref 1.7–7.7)
Neutrophils Relative %: 78 %
Platelets: 332 10*3/uL (ref 150–400)
RBC: 4.19 MIL/uL — ABNORMAL LOW (ref 4.22–5.81)
RDW: 13.8 % (ref 11.5–15.5)
WBC: 8.2 10*3/uL (ref 4.0–10.5)
nRBC: 0 % (ref 0.0–0.2)

## 2023-12-10 LAB — COMPREHENSIVE METABOLIC PANEL WITH GFR
ALT: 31 U/L (ref 0–44)
AST: 23 U/L (ref 15–41)
Albumin: 3.8 g/dL (ref 3.5–5.0)
Alkaline Phosphatase: 40 U/L (ref 38–126)
Anion gap: 9 (ref 5–15)
BUN: 12 mg/dL (ref 6–20)
CO2: 22 mmol/L (ref 22–32)
Calcium: 8.3 mg/dL — ABNORMAL LOW (ref 8.9–10.3)
Chloride: 85 mmol/L — ABNORMAL LOW (ref 98–111)
Creatinine, Ser: 0.8 mg/dL (ref 0.61–1.24)
GFR, Estimated: >60 mL/min (ref >60)
Glucose, Bld: 63 mg/dL — ABNORMAL LOW (ref 70–99)
Potassium: 4.1 mmol/L (ref 3.5–5.1)
Sodium: 116 mmol/L — CL (ref 135–145)
Total Bilirubin: 0.9 mg/dL (ref 0.0–1.2)
Total Protein: 6.1 g/dL — ABNORMAL LOW (ref 6.5–8.1)

## 2023-12-10 LAB — URINALYSIS, ROUTINE W REFLEX MICROSCOPIC
Bilirubin Urine: NEGATIVE
Glucose, UA: NEGATIVE mg/dL
Hgb urine dipstick: NEGATIVE
Ketones, ur: NEGATIVE mg/dL
Leukocytes,Ua: NEGATIVE
Nitrite: NEGATIVE
Protein, ur: NEGATIVE mg/dL
Specific Gravity, Urine: 1.001 — ABNORMAL LOW (ref 1.005–1.030)
pH: 7 (ref 5.0–8.0)

## 2023-12-10 LAB — CBG MONITORING, ED
Glucose-Capillary: 106 mg/dL — ABNORMAL HIGH (ref 70–99)
Glucose-Capillary: 92 mg/dL (ref 70–99)

## 2023-12-10 LAB — SODIUM, URINE, RANDOM: Sodium, Ur: 27 mmol/L

## 2023-12-10 LAB — CREATININE, URINE, RANDOM: Creatinine, Urine: 11 mg/dL

## 2023-12-10 MED ORDER — SODIUM CHLORIDE 0.9 % IV BOLUS
500.0000 mL | Freq: Once | INTRAVENOUS | Status: AC
  Administered 2023-12-10: 500 mL via INTRAVENOUS

## 2023-12-10 MED ORDER — CHLORHEXIDINE GLUCONATE CLOTH 2 % EX PADS
6.0000 | MEDICATED_PAD | Freq: Every day | CUTANEOUS | Status: DC
  Administered 2023-12-11 – 2023-12-12 (×2): 6 via TOPICAL

## 2023-12-10 NOTE — ED Triage Notes (Addendum)
 Pt to ED from home with c/o abnormal labs reported by PCP. Pt reports sodium was 120 and glucose 35. Pt denies any sx other than dizziness. Pt alert and oriented x 4. Denies hx of diabetes. Pt has service dog with him. CBG in triage is 106

## 2023-12-10 NOTE — ED Provider Notes (Signed)
 [place] EMERGENCY DEPARTMENT AT [facility] Provider Note   CSN: [id number] Arrival date & time: 12/10/23  2003     History  No chief complaint on file.   Jerome Lopez is a 53 y.o. male with PMH as listed below who presents from home with c/o abnormal labs reported by PCP. Pt reports sodium was 120 and glucose 35. Pt denies any sx other than dizziness. Denies falls/head trauma. Pt alert and oriented x 4. Denies hx of diabetes. Pt has service dog with him. CBG in triage is 106 . Per chart review there was concern about patient drinking to much fluid. Patient endorses that he drinks not only water but also gatorades/pedialytes and sodas.  Per PCP note from today: Hyponatremia: Reports that he had repeat imaging for his neck cancer completed on Monday. He is still awaiting results. He continues to drink excessive amounts of water. He has not drank any electrolytes in about a week as he has not wanted to go to the store. Reports that he has tried to eat better. Continues to have no symptoms r/t hyponatremia. Continues to report that he is no longer drinking alcohol.   Past Medical History:  Diagnosis Date   Anxiety    Depression    Hypertension    PTSD (post-traumatic stress disorder)        Home Medications Prior to Admission medications   Medication Sig Start Date End Date Taking? Authorizing Provider  buPROPion  (WELLBUTRIN  XL) 150 MG 24 hr tablet Take 1 tablet by mouth every morning. 10/01/23   [provider]  buPROPion  (WELLBUTRIN  XL) 300 MG 24 hr tablet Take 1 tablet by mouth every morning. 05/26/19   [provider]  DULoxetine  (CYMBALTA ) 60 MG capsule Take 60 mg by mouth daily. 05/31/14   [provider]  lisinopril (ZESTRIL) 40 MG tablet Take 40 mg by mouth daily. Patient not taking: Reported on 10/17/2023 06/08/20   [provider]  losartan  (COZAAR ) 100 MG tablet Take 1 tablet by mouth daily. 10/15/23 11/18/24  [provider]  propranolol  (INDERAL ) 10 MG tablet Take 20 mg by mouth daily.    [provider]      Allergies    Patient has no known allergies.    Review of Systems   Review of Systems A 10 point review of systems was performed and is negative unless otherwise reported in HPI.  Physical Exam Updated Vital Signs BP (!) 170/94 (BP Location: Right Arm)   Pulse 98   Temp 99 F (37.2 C)   Resp 16   Ht 5' 10 (1.778 m)   Wt 79.4 kg   SpO2 100%   BMI 25.11 kg/m  Physical Exam General: Normal appearing male, lying in bed.  HEENT: PERRLA, EOMI, no nystagmus, Sclera anicteric, MMM, trachea midline.  Cardiology: RRR, no murmurs/rubs/gallops.  Resp: Normal respiratory rate and effort. CTAB, no wheezes, rhonchi, crackles.  Abd: Soft, non-tender, non-distended. No rebound tenderness or guarding.  GU: Deferred. MSK: No peripheral edema or signs of trauma. Extremities without deformity or TTP. No cyanosis or clubbing. Skin: warm, dry. Neuro: A&Ox4, CNs II-XII grossly intact. MAEs. Sensation grossly intact.  Psych: Normal mood and affect.   ED Results / Procedures / Treatments   Labs (all labs ordered are listed, but only abnormal results are displayed) Labs Reviewed  COMPREHENSIVE METABOLIC PANEL WITH GFR - Abnormal; Notable for the following components:      Result Value   Sodium 116 (*)  Chloride 85 (*)    Glucose, Bld 63 (*)    Calcium 8.3 (*)    Total Protein 6.1 (*)    All other components within normal limits  CBC WITH DIFFERENTIAL/PLATELET - Abnormal; Notable for the following components:   RBC 4.19 (*)    HCT 37.3 (*)    MCHC 37.5 (*)    All other components within normal limits  URINALYSIS, ROUTINE W REFLEX MICROSCOPIC - Abnormal; Notable for the following components:   Color, Urine COLORLESS (*)    Specific Gravity, Urine 1.001 (*)    All other components within normal limits  CBG MONITORING, ED    EKG EKG Interpretation Date/Time:  Thursday December 11 2023 00:40:49 EDT Ventricular Rate:  89 PR Interval:  190 QRS Duration:  91 QT Interval:  337 QTC Calculation: 410 R Axis:   35  Text Interpretation: Sinus rhythm ST elev, probable normal early repol pattern Interpretation limited secondary to artifact Confirmed by Eldon Greenland [id number]) on 12/11/2023 12:53:43 AM  Radiology No results found.  Procedures .Critical Care  Performed by: Merdis Stalling, MD Authorized by: Merdis Stalling, MD   Critical care provider statement:    Critical care time (minutes):  30   Critical care was necessary to treat or prevent imminent or life-threatening deterioration of the following conditions:  Metabolic crisis   Critical care was time spent personally by me on the following activities:  Development of treatment plan with patient or surrogate, discussions with consultants, evaluation of patient's response to treatment, examination of patient, ordering and review of laboratory studies, ordering and performing treatments and interventions, pulse oximetry, re-evaluation of patient's condition, review of old charts and obtaining history from patient or surrogate   Care discussed with: admitting provider       Medications Ordered in ED Medications  sodium chloride  0.9 % bolus 500 mL (0 mLs Intravenous Stopped 12/10/23 2339)    ED Course/ Medical Decision Making/ A&P                          Medical Decision Making Amount and/or Complexity of Data Reviewed Labs: ordered. Decision-making details documented in ED Course.  Risk Decision regarding hospitalization.    This patient presents to the ED for concern of possible hyponatremia, this involves an extensive number of treatment options, and is a complaint that carries with it a high risk of complications and morbidity.  I considered the following differential and admission for this acute, potentially life threatening condition.   MDM:    Pt with Na found to be 116, downtrending. Patient  with hyponatremia, likely either euvolemic or hypervolemic given history, as he has had good PO intake, possibly even polydipsia. He doesn't take diuretics. Lower c/f SIADH. Will order osmolality and urine lytes. Have asked patient in the meantime in the ED to not drink fluids to start a fluid restriction and will give 500 cc normal saline. Patient feels somewhat dizzy but doesn't have any ataxia, somnolence, or seizure activity, will not give hypertonic at this time but rather CTM.  Patient is also found to be hypoglycemic to 35 on outpatient labs, and here on arrival to ED starts at 106 mg/dL, then becomes mildly hypoglycemic to 63 mg/dL, without history of diabetes or taking any insulin. He is given crackers with improvement and will CTM.  For his dizziness, considering other possible causes than his hyponatremia, he has no arrhythmia on EKG/telemetry and no anemia, renal  injury, or e/o infection. No nystagmus or room-spinning to indicate vertigo and no orthostatic sxs to indicate orthostasis.  Clinical Course as of 12/17/23 0948  Wed Dec 10, 2023  2042 Glucose-Capillary(!): 106 [HN]  2141 Sodium(!!): 116 +hyponatremia [HN]  2141 Glucose(!): 63 +hypoglycemia, will give crackers [HN]    Clinical Course User Index [HN] Merdis Stalling, MD    Labs: I Ordered, and personally interpreted labs.  The pertinent results include: those listed above  Additional history obtained from chart review.    Reevaluation: After the interventions noted above, I reevaluated the patient and found that they have :stayed the same  Social Determinants of Health: Lives independently  Disposition:  admitted to medicine  Co morbidities that complicate the patient evaluation  Past Medical History:  Diagnosis Date   Anxiety    Depression    Hypertension    PTSD (post-traumatic stress disorder)      Medicines No orders of the defined types were placed in this encounter.   I have reviewed the patients  home medicines and have made adjustments as needed  Problem List / ED Course: Problem List Items Addressed This Visit       Endocrine   Hypoglycemia     Other   * (Principal) Hyponatremia - Primary   Dizziness                This note was created using dictation software, which may contain spelling or grammatical errors.    Merdis Stalling, MD 12/17/23 [phone]

## 2023-12-10 NOTE — H&P (Signed)
 History and Physical    Patient: Jerome Lopez DOB: Jan 21, 1971 DOA: 12/10/2023 DOS: the patient was seen and examined on 12/11/2023 PCP: Almeda Jacobs, FNP  Patient coming from: Home  Chief Complaint: No chief complaint on file.  HPI: Jerome Lopez is a 53 y.o. male with medical history significant of hypertension, depression who presents to the emergency department due to abnormal labs.  Patient states that he had a follow-up checkup with his PCP today, workup done showed sodium of 120 and glucose of 35, he was notified by his PCP to go to an ED for further evaluation.  Patient only complained of dizziness.  He states that he drinks a lot of water without any electrolytes chronically.  He states that he drinks 4-5 tumblers of 40 oz and 28 oz of each daily and states that he usually gets a headache if he does not drink this much water.  Patient presented to the ED on/18/25 due to same presentation during which sodium level was 125, was given IV NS 500 mL x 2 and was advised to drink more Gatorade and to have some salt to his diet to compensate for the dilutional hyponatremia.  Patient was asked to follow-up with his PCP in 2 weeks from ED visit.  ED Course:  In the emergency department, BP was 170/24, but other vital signs were within normal range.  Workup in the ED showed normocytic anemia.  BMP was normal except for sodium of 116, chloride 85, blood glucose 63.  Urinalysis was normal.  Review of Systems: Review of systems as noted in the HPI. All other systems reviewed and are negative.   Past Medical History:  Diagnosis Date   Anxiety    Depression    Hypertension    PTSD (post-traumatic stress disorder)    Past Surgical History:  Procedure Laterality Date   HAND ARTHROPLASTY     SHOULDER SURGERY Left     Social History:  reports that he has quit smoking. He uses smokeless tobacco. He reports current alcohol use of about 10.0 standard drinks of  alcohol per week. He reports that he does not use drugs.   Allergies  Allergen Reactions   Amlodipine Swelling    Leg edema   Anesthesia S-I-40a [Propofol] Other (See Comments)    Possible MH, temp of 105  Unsure of which anesthetics specifically caused that    History reviewed. No pertinent family history.   Prior to Admission medications   Medication Sig Start Date End Date Taking? Authorizing Provider  buPROPion (WELLBUTRIN XL) 150 MG 24 hr tablet Take 1 tablet by mouth every morning. Take alongside 300 mg for total of 450 mg 10/01/23  Yes [provider]  buPROPion (WELLBUTRIN XL) 300 MG 24 hr tablet Take 1 tablet by mouth every morning. Take alongside 150 mg for total of 450 mg 05/26/19  Yes [provider]  DULoxetine (CYMBALTA) 60 MG capsule Take 60 mg by mouth daily. 05/31/14  Yes [provider]  hydrALAZINE (APRESOLINE) 50 MG tablet Take 50 mg by mouth in the morning and at bedtime. 11/19/23 11/18/24 Yes [provider]  losartan (COZAAR) 100 MG tablet Take 1 tablet by mouth daily. 10/15/23 11/18/24 Yes [provider]  propranolol (INDERAL) 10 MG tablet Take 20 mg by mouth daily.   Yes [provider]    Physical Exam: BP (!) 156/109   Pulse 83   Temp 99.2 F (37.3 C) (Oral)   Resp 13  Ht 5' 10 (1.778 m)   Wt 79.4 kg   SpO2 99%   BMI 25.11 kg/m   General: 53 y.o. year-old male well developed well nourished in no acute distress.  Alert and oriented x3. HEENT: NCAT, EOMI Neck: Supple, trachea medial Cardiovascular: Regular rate and rhythm with no rubs or gallops.  No thyromegaly or JVD noted.  No lower extremity edema. 2/4 pulses in all 4 extremities. Respiratory: Clear to auscultation with no wheezes or rales. Good inspiratory effort. Abdomen: Soft, nontender nondistended with normal bowel sounds x4 quadrants. Muskuloskeletal: No cyanosis, clubbing or edema noted bilaterally Neuro: CN II-XII intact, strength 5/5 x  4, sensation, reflexes intact Skin: No ulcerative lesions noted or rashes Psychiatry: Judgement and insight appear normal. Mood is appropriate for condition and setting          Labs on Admission:  Basic Metabolic Panel: Recent Labs  Lab 12/10/23 2043  NA 116*  K 4.1  CL 85*  CO2 22  GLUCOSE 63*  BUN 12  CREATININE 0.80  CALCIUM 8.3*   Liver Function Tests: Recent Labs  Lab 12/10/23 2043  AST 23  ALT 31  ALKPHOS 40  BILITOT 0.9  PROT 6.1*  ALBUMIN 3.8   No results for input(s): LIPASE, AMYLASE in the last 168 hours. No results for input(s): AMMONIA in the last 168 hours. CBC: Recent Labs  Lab 12/10/23 2043  WBC 8.2  NEUTROABS 6.4  HGB 14.0  HCT 37.3*  MCV 89.0  PLT 332   Cardiac Enzymes: No results for input(s): CKTOTAL, CKMB, CKMBINDEX, TROPONINI in the last 168 hours.  BNP (last 3 results) No results for input(s): BNP in the last 8760 hours.  ProBNP (last 3 results) No results for input(s): PROBNP in the last 8760 hours.  CBG: Recent Labs  Lab 12/10/23 2016 12/10/23 2220  GLUCAP 106* 92    Radiological Exams on Admission: No results found.  EKG: I independently viewed the EKG done and my findings are as followed: Normal sinus rhythm at a rate of 94 bpm  Assessment/Plan Present on Admission:  Hyponatremia  Principal Problem:   Hyponatremia Active Problems:   Hypoglycemia   Depression with anxiety   Essential hypertension  Acute on chronic hyponatremia Na 116, this was 125 on 10/17/2023.  It was 128 about 3 years ago This appears to be dilutional due to excessive water consumption Patient does not want water restriction IV NS 500 mL was provided in the ED, we shall continue with IV NS and monitor sodium level closely Urine sodium was 27 Urine osmolality and serum osmolality will be checked  Hypoglycemia CBG 63 > 92 Continue to monitor CBG  Essential hypertension Continue losartan, hydralazine,  Inderal  Depression with anxiety Continue Wellbutrin, Cymbalta   DVT prophylaxis: Lovenox  Code Status: Full code  Family Communication: None at bedside  Consults: None  Severity of Illness: The appropriate patient status for this patient is INPATIENT. Inpatient status is judged to be reasonable and necessary in order to provide the required intensity of service to ensure the patient's safety. The patient's presenting symptoms, physical exam findings, and initial radiographic and laboratory data in the context of their chronic comorbidities is felt to place them at high risk for further clinical deterioration. Furthermore, it is not anticipated that the patient will be medically stable for discharge from the hospital within 2 midnights of admission.   * I certify that at the point of admission it is my clinical judgment that the patient  will require inpatient hospital care spanning beyond 2 midnights from the point of admission due to high intensity of service, high risk for further deterioration and high frequency of surveillance required.*  Author: Aanchal Cope, DO 12/11/2023 12:42 AM  For on call review [email or web address].

## 2023-12-11 DIAGNOSIS — F418 Other specified anxiety disorders: Secondary | ICD-10-CM | POA: Insufficient documentation

## 2023-12-11 DIAGNOSIS — I10 Essential (primary) hypertension: Secondary | ICD-10-CM | POA: Diagnosis not present

## 2023-12-11 DIAGNOSIS — E162 Hypoglycemia, unspecified: Secondary | ICD-10-CM | POA: Diagnosis not present

## 2023-12-11 DIAGNOSIS — E871 Hypo-osmolality and hyponatremia: Secondary | ICD-10-CM | POA: Diagnosis not present

## 2023-12-11 DIAGNOSIS — R42 Dizziness and giddiness: Secondary | ICD-10-CM | POA: Diagnosis not present

## 2023-12-11 LAB — BASIC METABOLIC PANEL WITH GFR
Anion gap: 8 (ref 5–15)
BUN: 10 mg/dL (ref 6–20)
CO2: 24 mmol/L (ref 22–32)
Calcium: 8.8 mg/dL — ABNORMAL LOW (ref 8.9–10.3)
Chloride: 94 mmol/L — ABNORMAL LOW (ref 98–111)
Creatinine, Ser: 0.89 mg/dL (ref 0.61–1.24)
GFR, Estimated: >60 mL/min (ref >60)
Glucose, Bld: 107 mg/dL — ABNORMAL HIGH (ref 70–99)
Potassium: 4.2 mmol/L (ref 3.5–5.1)
Sodium: 126 mmol/L — ABNORMAL LOW (ref 135–145)

## 2023-12-11 LAB — COMPREHENSIVE METABOLIC PANEL WITH GFR
ALT: 32 U/L (ref 0–44)
AST: 23 U/L (ref 15–41)
Albumin: 3.6 g/dL (ref 3.5–5.0)
Alkaline Phosphatase: 37 U/L — ABNORMAL LOW (ref 38–126)
Anion gap: 7 (ref 5–15)
BUN: 11 mg/dL (ref 6–20)
CO2: 24 mmol/L (ref 22–32)
Calcium: 8.5 mg/dL — ABNORMAL LOW (ref 8.9–10.3)
Chloride: 93 mmol/L — ABNORMAL LOW (ref 98–111)
Creatinine, Ser: 0.8 mg/dL (ref 0.61–1.24)
GFR, Estimated: >60 mL/min (ref >60)
Glucose, Bld: 89 mg/dL (ref 70–99)
Potassium: 4.4 mmol/L (ref 3.5–5.1)
Sodium: 124 mmol/L — ABNORMAL LOW (ref 135–145)
Total Bilirubin: 1 mg/dL (ref 0.0–1.2)
Total Protein: 6.1 g/dL — ABNORMAL LOW (ref 6.5–8.1)

## 2023-12-11 LAB — CBC
HCT: 38.3 % — ABNORMAL LOW (ref 39.0–52.0)
Hemoglobin: 14.3 g/dL (ref 13.0–17.0)
MCH: 33.3 pg (ref 26.0–34.0)
MCHC: 37.3 g/dL — ABNORMAL HIGH (ref 30.0–36.0)
MCV: 89.3 fL (ref 80.0–100.0)
Platelets: 297 10*3/uL (ref 150–400)
RBC: 4.29 MIL/uL (ref 4.22–5.81)
RDW: 13.6 % (ref 11.5–15.5)
WBC: 6.6 10*3/uL (ref 4.0–10.5)
nRBC: 0 % (ref 0.0–0.2)

## 2023-12-11 LAB — OSMOLALITY: Osmolality: 258 mosm/kg — ABNORMAL LOW (ref 275–295)

## 2023-12-11 LAB — GLUCOSE, CAPILLARY
Glucose-Capillary: 92 mg/dL (ref 70–99)
Glucose-Capillary: 100 mg/dL — ABNORMAL HIGH (ref 70–99)
Glucose-Capillary: 95 mg/dL (ref 70–99)
Glucose-Capillary: 98 mg/dL (ref 70–99)
Glucose-Capillary: 112 mg/dL — ABNORMAL HIGH (ref 70–99)
Glucose-Capillary: 120 mg/dL — ABNORMAL HIGH (ref 70–99)

## 2023-12-11 LAB — HIV ANTIBODY (ROUTINE TESTING W REFLEX): HIV Screen 4th Generation wRfx: NONREACTIVE

## 2023-12-11 LAB — MRSA NEXT GEN BY PCR, NASAL: MRSA by PCR Next Gen: NOT DETECTED

## 2023-12-11 LAB — TSH: TSH: 4.549 u[IU]/mL — ABNORMAL HIGH (ref 0.350–4.500)

## 2023-12-11 LAB — MAGNESIUM: Magnesium: 1.9 mg/dL (ref 1.7–2.4)

## 2023-12-11 LAB — PHOSPHORUS: Phosphorus: 2.5 mg/dL (ref 2.5–4.6)

## 2023-12-11 LAB — CORTISOL: Cortisol, Plasma: 3.8 ug/dL

## 2023-12-11 LAB — OSMOLALITY, URINE: Osmolality, Ur: 132 mosm/kg — ABNORMAL LOW (ref 300–900)

## 2023-12-11 LAB — T4, FREE: Free T4: 0.98 ng/dL (ref 0.61–1.12)

## 2023-12-11 MED ORDER — HYDRALAZINE HCL 20 MG/ML IJ SOLN
10.0000 mg | Freq: Four times a day (QID) | INTRAMUSCULAR | Status: DC | PRN
  Administered 2023-12-11: 10 mg via INTRAVENOUS
  Filled 2023-12-11: qty 1

## 2023-12-11 MED ORDER — PROPRANOLOL HCL 20 MG PO TABS
20.0000 mg | ORAL_TABLET | Freq: Every day | ORAL | Status: DC
  Administered 2023-12-11 – 2023-12-15 (×5): 20 mg via ORAL
  Filled 2023-12-11 (×5): qty 1

## 2023-12-11 MED ORDER — LOSARTAN POTASSIUM 50 MG PO TABS
100.0000 mg | ORAL_TABLET | Freq: Every day | ORAL | Status: DC
  Administered 2023-12-11 – 2023-12-12 (×2): 100 mg via ORAL
  Filled 2023-12-11 (×3): qty 2

## 2023-12-11 MED ORDER — ACETAMINOPHEN 325 MG PO TABS: 650.0000 mg | ORAL_TABLET | Freq: Four times a day (QID) | ORAL | Status: DC | PRN

## 2023-12-11 MED ORDER — ACETAMINOPHEN 650 MG RE SUPP: 650.0000 mg | Freq: Four times a day (QID) | RECTAL | Status: DC | PRN

## 2023-12-11 MED ORDER — DULOXETINE HCL 60 MG PO CPEP
120.0000 mg | ORAL_CAPSULE | Freq: Every day | ORAL | Status: DC
  Administered 2023-12-12 – 2023-12-15 (×4): 120 mg via ORAL
  Filled 2023-12-11 (×5): qty 2

## 2023-12-11 MED ORDER — DULOXETINE HCL 60 MG PO CPEP
60.0000 mg | ORAL_CAPSULE | Freq: Every day | ORAL | Status: DC
  Administered 2023-12-11: 60 mg via ORAL
  Filled 2023-12-11: qty 1

## 2023-12-11 MED ORDER — DULOXETINE HCL 60 MG PO CPEP
60.0000 mg | ORAL_CAPSULE | Freq: Once | ORAL | Status: AC
  Administered 2023-12-11: 60 mg via ORAL
  Filled 2023-12-11: qty 1

## 2023-12-11 MED ORDER — ENOXAPARIN SODIUM 40 MG/0.4ML IJ SOSY
40.0000 mg | PREFILLED_SYRINGE | INTRAMUSCULAR | Status: DC
  Administered 2023-12-11 – 2023-12-14 (×4): 40 mg via SUBCUTANEOUS
  Filled 2023-12-11 (×4): qty 0.4

## 2023-12-11 MED ORDER — BUPROPION HCL ER (XL) 150 MG PO TB24
150.0000 mg | ORAL_TABLET | Freq: Every morning | ORAL | Status: DC
  Filled 2023-12-11: qty 1

## 2023-12-11 MED ORDER — ONDANSETRON HCL 4 MG/2ML IJ SOLN: 4.0000 mg | Freq: Four times a day (QID) | INTRAMUSCULAR | Status: DC | PRN

## 2023-12-11 MED ORDER — ONDANSETRON HCL 4 MG PO TABS
4.0000 mg | ORAL_TABLET | Freq: Four times a day (QID) | ORAL | Status: AC | PRN
Start: 2023-12-11 — End: ?

## 2023-12-11 MED ORDER — SODIUM CHLORIDE 0.9 % IV SOLN: INTRAVENOUS | Status: DC

## 2023-12-11 MED ORDER — HYDRALAZINE HCL 25 MG PO TABS
50.0000 mg | ORAL_TABLET | Freq: Two times a day (BID) | ORAL | Status: DC
  Administered 2023-12-11 (×2): 50 mg via ORAL
  Filled 2023-12-11: qty 2
  Filled 2023-12-11: qty 1
  Filled 2023-12-11: qty 2

## 2023-12-11 MED ORDER — BUPROPION HCL ER (XL) 150 MG PO TB24
300.0000 mg | ORAL_TABLET | Freq: Every morning | ORAL | Status: DC
  Filled 2023-12-11: qty 2

## 2023-12-11 MED ORDER — BUPROPION HCL ER (XL) 300 MG PO TB24
450.0000 mg | ORAL_TABLET | Freq: Every morning | ORAL | Status: DC
  Administered 2023-12-11 – 2023-12-15 (×5): 450 mg via ORAL
  Filled 2023-12-11 (×4): qty 1

## 2023-12-11 NOTE — Hospital Course (Addendum)
 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  The patient was noted to have a sodium 121 with glucose 35 on his outpatient labs.  He was directed to come to the emergency department. Notably, the patient had low sodium his labs and was directed to go to the ED 10/17/23  At that time, the patient was discharged home from the ED in stable condition.  Notably, the patient states that he drinks 4 x 40 ounces of water in addition to 4 x 24 ounces of water in a different cup daily.  He states that he had been instructed to take electrolytes in the past, so he used Propel for the past week but ran out about 5 days prior to this admission. Review of the medical record shows that the patient has been struggling with low sodiums for at least the last 3 years, but they have worsened since March 2025 now with Na <125. He has for the most part asymptomatic.  However, he stated that yesterday he was feeling dizzy after he left his PCP office. He denies any new medications.  Patient denies fevers, chills, headache, chest pain, dyspnea, nausea, vomiting, diarrhea, abdominal pain, dysuria.  He states that he essentially only eats 1 meal per day.  He states that he has been doing that for a number of years.  In the ED, the patient had low-grade temperature 99.2 F.  He is hemodynamically stable with oxygen saturation 97-100% on room air.  Sodium 116, potassium 4.1, bicarbonate 22, serum creatinine 0.89.  WBC 8.2, hemoglobin 14.0, platelets 332.  The patient was given a 500 cc normal saline bolus and started on normal saline at 80 cc/h.  Nephrology was consulted and patient was started on NaCl tabs with slow improvement of Na.  His free water intake was somewhat restricted during the hospitalization.  Lasix 10 mg po daily was added.  The patient remained asymptomatic.  It  was felt patient has a reset renal osmostat with baseline serum Na 124-126.  He was d/ced with instructions to f/u with his PCP for repeat Na in 3-4 days.

## 2023-12-11 NOTE — Progress Notes (Signed)
 Charge nurse contacted me at [phone] asking me to speak with patient and his wife regarding care for the service dog that is accompanying the patient. Patient, wife and dog are at bedside. Patient and wife expressed concerns that the patient cannot take the dog outside to toilet and are asking why staff cannot take the patient outside with the dog.  Policy was given to the patient and wife and explained that in ICU patients cannot safely leave the area without being monitored. Options, were discussed that were in the policy and to ask the physician if he felt comfortable allowing the patient to take his dog out.  Information give to physician by primary RN Hiram Lukes and shared with Risk Management and Patient Experience.

## 2023-12-11 NOTE — Progress Notes (Signed)
 Progress note IV NS was discontinued due to rapid increase in sodium from 116 to 124

## 2023-12-11 NOTE — Plan of Care (Signed)

## 2023-12-11 NOTE — TOC CM/SW Note (Signed)
 Transition of Care [facility]) - Inpatient Brief Assessment   Patient Details  Name: Jerome Lopez MRN: [id number] Date of Birth: 25-May-1971  Transition of Care [facility]) CM/SW Contact:    Cyndie Dredge, LCSWA Phone Number: 12/11/2023, 9:50 AM   Clinical Narrative:  Transition of Care Department [facility]) has reviewed patient and no TOC needs have been identified at this time. We will continue to monitor patient advancement through interdisciplinary progression rounds. If new patient transition needs arise, please place a TOC consult.   Transition of Care Asessment: Insurance and Status: Insurance coverage has been reviewed Patient has primary care physician: Yes Home environment has been reviewed: Single Family Home with spouse Prior level of function:: Independent Prior/Current Home Services: No current home services Social Drivers of Health Review: SDOH reviewed no interventions necessary Readmission risk has been reviewed: Yes Transition of care needs: no transition of care needs at this time

## 2023-12-11 NOTE — Plan of Care (Signed)
  Problem: Education: Goal: Knowledge of General Education information will improve Description: Including pain rating scale, medication(s)/side effects and non-pharmacologic comfort measures Outcome: Progressing   Problem: Clinical Measurements: Goal: Will remain free from infection Outcome: Progressing Goal: Cardiovascular complication will be avoided Outcome: Progressing   Problem: Activity: Goal: Risk for activity intolerance will decrease Outcome: Progressing   Problem: Nutrition: Goal: Adequate nutrition will be maintained Outcome: Progressing   Problem: Coping: Goal: Level of anxiety will decrease Outcome: Progressing   Problem: Elimination: Goal: Will not experience complications related to urinary retention Outcome: Progressing   Problem: Pain Managment: Goal: General experience of comfort will improve and/or be controlled Outcome: Progressing   Problem: Safety: Goal: Ability to remain free from injury will improve Outcome: Progressing   Problem: Skin Integrity: Goal: Risk for impaired skin integrity will decrease Outcome: Progressing

## 2023-12-11 NOTE — Progress Notes (Signed)
 PROGRESS NOTE  Jerome Lopez ZOX:[id number] DOB: December 09, 1970 DOA: 12/10/2023 PCP: Almeda Jacobs, FNP  Brief History:  53 year old with a history SCC left tonsil s/p tonsillectomy and XRT (12/2021 to 01/2022) PTSD and hypertension presenting with abdominal labs that were drawn by his outpatient provider on 12/10/2023.  The patient was noted to have a sodium 121 with glucose 35 on his outpatient labs.  He was directed to come to the emergency department. Notably, the patient had low sodium his labs and was directed to go to the ED 10/17/23  At that time, the patient was discharged home from the ED in stable condition.  Notably, the patient states that he drinks 4 x 40 ounces of water in addition to 4 x 24 ounces of water in a different cup daily.  He states that he had been instructed to take electrolytes in the past, so he used Propel for the past week but ran out about 5 days prior to this admission. Review of the medical record shows that the patient has been struggling with low sodiums for at least the last 3 years, but they have worsened since March 2025 now with Na <125. He has for the most part asymptomatic.  However, he stated that yesterday he was feeling dizzy after he left his PCP office. He denies any new medications.  Patient denies fevers, chills, headache, chest pain, dyspnea, nausea, vomiting, diarrhea, abdominal pain, dysuria.  He states that he essentially only eats 1 meal per day.  He states that he has been doing that for a number of years.  In the ED, the patient had low-grade temperature 99.2 F.  He is hemodynamically stable with oxygen saturation 97-100% on room air.  Sodium 116, potassium 4.1, bicarbonate 22, serum creatinine 0.89.  WBC 8.2, hemoglobin 14.0, platelets 332.  The patient was given a 500 cc normal saline bolus and started on normal saline at 80 cc/h.     Assessment/Plan: Hyponatremia - Secondary to polydipsia/excessive free water intake -  Serum osmolarity - Urine osmolarity - Urine sodium 27 - Patient does not desire to cut back on his free water intake - Nephrology consult - Certainly, his Wellbutrin and Cymbalta may be contributing, but he has been on these medicines for a number of years  PTSD -Continue Wellbutrin and Cymbalta  Hypoglycemia -Review of the record shows that this is also been a recurrent issue for him as well -Likely secondary to poor oral intake -Check A1c -TSH -random cortisol  Essential hypertension - Continue hydralazine and losartan  Migraine headache -continue propranolol       Family Communication:  no Family at bedside  Consultants:  renal  Code Status:  FULL  DVT Prophylaxis:   [place] Lovenox   Procedures: As Listed in Progress Note Above  Antibiotics: None     Subjective: Patient denies fevers, chills, headache, chest pain, dyspnea, nausea, vomiting, diarrhea, abdominal pain, dysuria, hematuria, hematochezia, and melena.   Objective: Vitals:   12/11/23 0600 12/11/23 0650 12/11/23 0700 12/11/23 0746  BP: (!) 174/126 (!) 166/92 (!) 160/86   Pulse: 79 82 84   Resp: 15 15 15    Temp:    98.3 F (36.8 C)  TempSrc:    Oral  SpO2: 97% 95% 96%   Weight:      Height:        Intake/Output Summary (Last 24 hours) at 12/11/2023 0838 Last data filed at 12/11/2023 0400 Gross per 24  hour  Intake 589.07 ml  Output 1050 ml  Net -460.93 ml   Weight change:  Exam:  General:  Pt is alert, follows commands appropriately, not in acute distress HEENT: No icterus, No thrush, No neck mass, [place]/AT Cardiovascular: RRR, S1/S2, no rubs, no gallops Respiratory: CTA bilaterally, no wheezing, no crackles, no rhonchi Abdomen: Soft/+BS, non tender, non distended, no guarding Extremities: No edema, No lymphangitis, No petechiae, No rashes, no synovitis   Data Reviewed: I have personally reviewed following labs and imaging studies Basic Metabolic Panel: Recent Labs  Lab  12/10/23 2043 12/11/23 0420  NA 116* 124*  K 4.1 4.4  CL 85* 93*  CO2 22 24  GLUCOSE 63* 89  BUN 12 11  CREATININE 0.80 0.80  CALCIUM 8.3* 8.5*  MG  --  1.9  PHOS  --  2.5   Liver Function Tests: Recent Labs  Lab 12/10/23 2043 12/11/23 0420  AST 23 23  ALT 31 32  ALKPHOS 40 37*  BILITOT 0.9 1.0  PROT 6.1* 6.1*  ALBUMIN 3.8 3.6   No results for input(s): LIPASE, AMYLASE in the last 168 hours. No results for input(s): AMMONIA in the last 168 hours. Coagulation Profile: No results for input(s): INR, PROTIME in the last 168 hours. CBC: Recent Labs  Lab 12/10/23 2043 12/11/23 0420  WBC 8.2 6.6  NEUTROABS 6.4  --   HGB 14.0 14.3  HCT 37.3* 38.3*  MCV 89.0 89.3  PLT 332 297   Cardiac Enzymes: No results for input(s): CKTOTAL, CKMB, CKMBINDEX, TROPONINI in the last 168 hours. BNP: Invalid input(s): POCBNP CBG: Recent Labs  Lab 12/10/23 2016 12/10/23 2220 12/11/23 0118 12/11/23 0411 12/11/23 0734  GLUCAP 106* 92 92 100* 95   HbA1C: No results for input(s): HGBA1C in the last 72 hours. Urine analysis:    Component Value Date/Time   COLORURINE COLORLESS (A) 12/10/2023 2335   APPEARANCEUR CLEAR 12/10/2023 2335   LABSPEC 1.001 (L) 12/10/2023 2335   PHURINE 7.0 12/10/2023 2335   GLUCOSEU NEGATIVE 12/10/2023 2335   HGBUR NEGATIVE 12/10/2023 2335   BILIRUBINUR NEGATIVE 12/10/2023 2335   KETONESUR NEGATIVE 12/10/2023 2335   PROTEINUR NEGATIVE 12/10/2023 2335   NITRITE NEGATIVE 12/10/2023 2335   LEUKOCYTESUR NEGATIVE 12/10/2023 2335   Sepsis Labs: @LABRCNTIP (procalcitonin:4,lacticidven:4) ) Recent Results (from the past 240 hours)  MRSA Next Gen by PCR, Nasal     Status: None   Collection Time: 12/11/23  1:00 AM   Specimen: Nasal Mucosa; Nasal Swab  Result Value Ref Range Status   MRSA by PCR Next Gen NOT DETECTED NOT DETECTED Final    Comment: (NOTE) The GeneXpert MRSA Assay (FDA approved for NASAL specimens only), is one  component of a comprehensive MRSA colonization surveillance program. It is not intended to diagnose MRSA infection nor to guide or monitor treatment for MRSA infections. Test performance is not FDA approved in patients less than 43 years old. Performed at [facility], [address], [place], [place] [address]      Scheduled Meds:  buPROPion  150 mg Oral q morning   buPROPion  300 mg Oral q morning   Chlorhexidine Gluconate Cloth  6 each Topical Daily   DULoxetine  60 mg Oral Daily   enoxaparin (LOVENOX) injection  40 mg Subcutaneous Q24H   hydrALAZINE  50 mg Oral BID   losartan  100 mg Oral Daily   propranolol  20 mg Oral Daily   Continuous Infusions:  Procedures/Studies: No results found.  Demaris Fillers, DO  [facility]  If 7PM-7AM, please contact night-coverage [email or web address] Password [facility] 12/11/2023, 8:38 AM   LOS: 1 day

## 2023-12-12 DIAGNOSIS — I10 Essential (primary) hypertension: Secondary | ICD-10-CM | POA: Diagnosis not present

## 2023-12-12 DIAGNOSIS — F418 Other specified anxiety disorders: Secondary | ICD-10-CM | POA: Diagnosis not present

## 2023-12-12 DIAGNOSIS — E871 Hypo-osmolality and hyponatremia: Secondary | ICD-10-CM | POA: Diagnosis not present

## 2023-12-12 LAB — BASIC METABOLIC PANEL WITH GFR
Anion gap: 11 (ref 5–15)
BUN: 9 mg/dL (ref 6–20)
CO2: 25 mmol/L (ref 22–32)
Calcium: 8.9 mg/dL (ref 8.9–10.3)
Chloride: 88 mmol/L — ABNORMAL LOW (ref 98–111)
Creatinine, Ser: 0.87 mg/dL (ref 0.61–1.24)
GFR, Estimated: >60 mL/min (ref >60)
Glucose, Bld: 103 mg/dL — ABNORMAL HIGH (ref 70–99)
Potassium: 3.9 mmol/L (ref 3.5–5.1)
Sodium: 124 mmol/L — ABNORMAL LOW (ref 135–145)

## 2023-12-12 LAB — GLUCOSE, CAPILLARY
Glucose-Capillary: 117 mg/dL — ABNORMAL HIGH (ref 70–99)
Glucose-Capillary: 154 mg/dL — ABNORMAL HIGH (ref 70–99)
Glucose-Capillary: 76 mg/dL (ref 70–99)
Glucose-Capillary: 86 mg/dL (ref 70–99)
Glucose-Capillary: 89 mg/dL (ref 70–99)
Glucose-Capillary: 98 mg/dL (ref 70–99)

## 2023-12-12 LAB — HEMOGLOBIN A1C
Hgb A1c MFr Bld: 5 % (ref 4.8–5.6)
Mean Plasma Glucose: 97 mg/dL

## 2023-12-12 MED ORDER — SODIUM CHLORIDE 1 G PO TABS
1.0000 g | ORAL_TABLET | Freq: Two times a day (BID) | ORAL | Status: DC
  Administered 2023-12-12 – 2023-12-14 (×6): 1 g via ORAL
  Filled 2023-12-12 (×7): qty 1

## 2023-12-12 MED ORDER — COSYNTROPIN 0.25 MG IJ SOLR
0.2500 mg | Freq: Once | INTRAMUSCULAR | Status: AC
  Administered 2023-12-13: 0.25 mg via INTRAVENOUS
  Filled 2023-12-12 (×2): qty 0.25

## 2023-12-12 MED ORDER — HYDRALAZINE HCL 25 MG PO TABS
50.0000 mg | ORAL_TABLET | Freq: Three times a day (TID) | ORAL | Status: DC
  Administered 2023-12-12 – 2023-12-15 (×10): 50 mg via ORAL
  Filled 2023-12-12 (×9): qty 2

## 2023-12-12 NOTE — Care Management Important Message (Signed)
 Important Message  Patient Details  Name: Jerome Lopez MRN: [id number] Date of Birth: [birth year]   Important Message Given:  Yes - Medicare IM     [name] [name] 12/12/2023, 10:50 AM

## 2023-12-12 NOTE — Plan of Care (Signed)
  Problem: Education: Goal: Knowledge of General Education information will improve Description: Including pain rating scale, medication(s)/side effects and non-pharmacologic comfort measures Outcome: Progressing   Problem: Clinical Measurements: Goal: Ability to maintain clinical measurements within normal limits will improve Outcome: Progressing Goal: Will remain free from infection Outcome: Progressing Goal: Respiratory complications will improve Outcome: Progressing Goal: Cardiovascular complication will be avoided Outcome: Progressing   Problem: Activity: Goal: Risk for activity intolerance will decrease Outcome: Progressing   Problem: Elimination: Goal: Will not experience complications related to bowel motility Outcome: Progressing Goal: Will not experience complications related to urinary retention Outcome: Progressing   Problem: Pain Managment: Goal: General experience of comfort will improve and/or be controlled Outcome: Progressing

## 2023-12-12 NOTE — Consult Note (Addendum)
 Reason for Consult: acute on chronic hyponatremia Referring Physician: Tat, MD  Jerome Lopez is an 53 y.o. male with a PMH significant for anxiety, depression, PTSD, throat cancer s/p chemoradiation,HTN, and chronic hyponatremia who was instructed to go to the [facility] ED on 12/10/23 due to abnormal labs at his PCP's office.  The sodium level was 120 and his glucose level was 35.  In the ED, Temp 99, Bp 170/94, HR 98, RR 16, SpO2 100%.  Labs notable for Na 116, Cl 85, gluc 63.  He was admitted to the ICU and started on normal saline and we were consulted to help manage his hyponatremia.  He admits to drinking over 300 oz of water daily and only eats one meal per day.  He reports that he gets migraines if he doesn't drink that much water.  He had been instructed to drink Gatorade to help with his low sodium levels, but he admits that he ran out of gatorade and was drinking mainly water.  He has had issues with hyponatremia since 2021.  He was hospitalized last year with hyponatremia in the setting of alcohol intoxication.  He denies any N/V/D, lower extremity edema, SOB, orthopnea, or PND.   Trend in Serum Sodium: Sodium  Date/Time Value Ref Range Status  12/12/2023 07:49 AM 124 (L) 135 - 145 mmol/L Final  12/11/2023 08:33 AM 126 (L) 135 - 145 mmol/L Final  12/11/2023 04:20 AM 124 (L) 135 - 145 mmol/L Final  12/10/2023 08:43 PM 116 (LL) 135 - 145 mmol/L Final  11/19/2023 18:00 PM 123 (L)    11/01/2023 12:00 PM 123 (L)    10/29/2023 16:32 PM 118 (LL)    10/17/2023 02:19 PM 125 (L) 135 - 145 mmol/L Final  10/17/2023 09:01 AM 125 (L) 135 - 145 mmol/L Final  09/11/2023  123 (L)    09/10/2023 125 (L)    07/21/2020 02:07 PM 128 (L) 135 - 145 mmol/L Final    PMH:   Past Medical History:  Diagnosis Date   Anxiety    Depression    Hypertension    PTSD (post-traumatic stress disorder)     PSH:   Past Surgical History:  Procedure Laterality Date   HAND ARTHROPLASTY     SHOULDER SURGERY Left      Allergies:  Allergies  Allergen Reactions   Amlodipine Swelling    Leg edema   Anesthesia S-I-40a [Propofol] Other (See Comments)    Possible MH, temp of 105  Unsure of which anesthetics specifically caused that    Medications:   Prior to Admission medications   Medication Sig Start Date End Date Taking? Authorizing Provider  buPROPion  (WELLBUTRIN  XL) 150 MG 24 hr tablet Take 1 tablet by mouth every morning. Take alongside 300 mg for total of 450 mg 10/01/23  Yes [provider]  buPROPion  (WELLBUTRIN  XL) 300 MG 24 hr tablet Take 1 tablet by mouth every morning. Take alongside 150 mg for total of 450 mg 05/26/19  Yes [provider]  DULoxetine  (CYMBALTA ) 60 MG capsule Take 120 mg by mouth daily. 05/31/14  Yes [provider]  hydrALAZINE  (APRESOLINE ) 50 MG tablet Take 50 mg by mouth in the morning and at bedtime. 11/19/23 11/18/24 Yes [provider]  losartan  (COZAAR ) 100 MG tablet Take 1 tablet by mouth daily. 10/15/23 11/18/24 Yes [provider]  propranolol  (INDERAL ) 10 MG tablet Take 20 mg by mouth daily.   Yes [provider]    Discontinued Meds:  Medications Discontinued During This Encounter  Medication Reason   lisinopril (ZESTRIL) 40 MG tablet Discontinued by provider   0.9 %  sodium chloride  infusion    buPROPion  (WELLBUTRIN  XL) 24 hr tablet 150 mg    buPROPion  (WELLBUTRIN  XL) 24 hr tablet 300 mg    DULoxetine  (CYMBALTA ) DR capsule 60 mg     Social History:  reports that he has quit smoking. He uses smokeless tobacco. He reports current alcohol use of about 10.0 standard drinks of alcohol per week. He reports that he does not use drugs.  Family History:  History reviewed. No pertinent family history.  A comprehensive review of systems was negative.  Blood pressure (!) 159/115, pulse 83, temperature 98 F (36.7 C), temperature source Oral, resp. rate 19, height 5' 10 (1.778 m), weight 79.4 kg, SpO2  98%. General appearance: alert, cooperative, and no distress Head: Normocephalic, without obvious abnormality, atraumatic Eyes: negative findings: lids and lashes normal, conjunctivae and sclerae normal, and corneas clear Resp: clear to auscultation bilaterally Cardio: regular rate and rhythm, S1, S2 normal, no murmur, click, rub or gallop GI: soft, non-tender; bowel sounds normal; no masses,  no organomegaly Extremities: extremities normal, atraumatic, no cyanosis or edema  Labs: Basic Metabolic Panel: Recent Labs  Lab 12/10/23 2043 12/11/23 0420 12/11/23 0833 12/12/23 0749  NA 116* 124* 126* 124*  K 4.1 4.4 4.2 3.9  CL 85* 93* 94* 88*  CO2 22 24 24 25   GLUCOSE 63* 89 107* 103*  BUN 12 11 10 9   CREATININE 0.80 0.80 0.89 0.87  ALBUMIN 3.8 3.6  --   --   CALCIUM 8.3* 8.5* 8.8* 8.9  PHOS  --  2.5  --   --    Liver Function Tests: Recent Labs  Lab 12/10/23 2043 12/11/23 0420  AST 23 23  ALT 31 32  ALKPHOS 40 37*  BILITOT 0.9 1.0  PROT 6.1* 6.1*  ALBUMIN 3.8 3.6   No results for input(s): LIPASE, AMYLASE in the last 168 hours. No results for input(s): AMMONIA in the last 168 hours. CBC: Recent Labs  Lab 12/10/23 2043 12/11/23 0420  WBC 8.2 6.6  NEUTROABS 6.4  --   HGB 14.0 14.3  HCT 37.3* 38.3*  MCV 89.0 89.3  PLT 332 297   PT/INR: @labrcntip (inr:5) Cardiac Enzymes: No results for input(s): CKTOTAL, CKMB, CKMBINDEX, TROPONINI in the last 168 hours. CBG: Recent Labs  Lab 12/11/23 1642 12/11/23 2007 12/12/23 0013 12/12/23 0407 12/12/23 0724  GLUCAP 112* 120* 154* 98 86    Iron Studies: No results for input(s): IRON, TIBC, TRANSFERRIN, FERRITIN in the last 168 hours.  Xrays/Other Studies: No results found.   Assessment/Plan: Acute on chronic hyponatremia - baseline sodium level has been 123-126 for the past 4 years.  His cortisol level was low at 3.8, TSH high at 4.55, free T4 WNL, Sosm 258, Uosm 132, UNa 27.  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  Doubt he has adrenal insufficiency given his elevated blood pressure but agree with cosyntropin stimulation test.  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  He had a CT scan of chest and neck to make sure he hasn't had recurrence of his throat cancer as another cause of SIADH.  Would recommend NaCl tabs but needs better control of his blood pressure.  He is asymptomatic and near his baseline.  Will need to continue to follow up with his PCP after discharge.  HTN - poorly controlled.  He is on losartan  100 mg, propranolol  20 mg daily, hydralazine  50 mg bid.  He has edema with amlodipine.  Will increase hydralazine  to tid and may  need to go up to 100 mg. Hypoglycemia - ACTH stim test pending. Anxiety/depression -continue with his current medications. Throat cancer - s/p chemoradiation and in remission.   Jerome Lopez 12/12/2023, 9:07 AM   This patient will not be physically seen over the weekend but his chart will be reviewed remotely.  On-call coverage will be available over the weekend.

## 2023-12-12 NOTE — Progress Notes (Signed)
 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  The patient was noted to have a sodium 121 with glucose 35 on his outpatient labs.  He was directed to come to the emergency department. Notably, the patient had low sodium his labs and was directed to go to the ED 10/17/23  At that time, the patient was discharged home from the ED in stable condition.  Notably, the patient states that he drinks 4 x 40 ounces of water in addition to 4 x 24 ounces of water in a different cup daily.  He states that he had been instructed to take electrolytes in the past, so he used Propel for the past week but ran out about 5 days prior to this admission. Review of the medical record shows that the patient has been struggling with low sodiums for at least the last 3 years, but they have worsened since March 2025 now with Na <125. He has for the most part asymptomatic.  However, he stated that yesterday he was feeling dizzy after he left his PCP office. He denies any new medications.  Patient denies fevers, chills, headache, chest pain, dyspnea, nausea, vomiting, diarrhea, abdominal pain, dysuria.  He states that he essentially only eats 1 meal per day.  He states that he has been doing that for a number of years.  In the ED, the patient had low-grade temperature 99.2 F.  He is hemodynamically stable with oxygen saturation 97-100% on room air.  Sodium 116, potassium 4.1, bicarbonate 22, serum creatinine 0.89.  WBC 8.2, hemoglobin 14.0, platelets 332.  The patient was given a 500 cc normal saline bolus and started on normal saline at 80 cc/h.     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549, Free T4--0.98 -random cortisol 3.8   Essential hypertension - Continue hydralazine  and losartan    Migraine headache -continue propranolol   Low Cortisol -cortrosyn stimuation        Family Communication:  no Family at bedside  Consultants:  renal  Code Status:  FULL  DVT Prophylaxis:  [place] Heparin / [place] Lovenox    Procedures: As Listed in Progress Note Above  Antibiotics: None     Subjective: Patient denies fevers, chills, headache, chest pain, dyspnea, nausea, vomiting, diarrhea, abdominal pain, dysuria, hematuria, hematochezia, and melena.   Objective: Vitals:   12/11/23 1332 12/11/23 1900 12/12/23 0411 12/12/23 1005  BP: (!) 136/96 (!) 155/98 (!) 159/115 (!) 159/115  Pulse: 78 85 83 83  Resp: 17 18 19    Temp: 98.2 F (36.8 C) 98 F (36.7 C) 98 F (36.7 C)   TempSrc:  Oral Oral   SpO2: 99% 100% 98%   Weight:      Height:        Intake/Output Summary (Last 24 hours) at 12/12/2023 1027 Last data filed at 12/12/2023 0855 Gross per 24 hour  Intake 420 ml  Output 1350 ml  Net -930 ml   Weight change:  Exam:  General:  Pt is alert, follows commands appropriately, not in acute distress HEENT: No icterus, No thrush, No neck mass, [place]/AT Cardiovascular: RRR, S1/S2, no rubs, no gallops Respiratory: CTA bilaterally, no wheezing, no crackles, no rhonchi Abdomen: Soft/+BS, non tender, non  distended, no guarding Extremities: No edema, No lymphangitis, No petechiae, No rashes, no synovitis   Data Reviewed: I have personally reviewed following labs and imaging studies Basic Metabolic Panel: Recent Labs  Lab 12/10/23 2043 12/11/23 0420 12/11/23 0833 12/12/23 0749  NA 116* 124* 126* 124*  K 4.1 4.4 4.2 3.9  CL 85* 93* 94* 88*  CO2 22 24 24 25   GLUCOSE 63* 89 107* 103*  BUN 12 11 10 9   CREATININE 0.80 0.80 0.89 0.87  CALCIUM 8.3* 8.5* 8.8* 8.9  MG  --  1.9  --   --   PHOS  --  2.5  --   --    Liver Function Tests: Recent Labs  Lab 12/10/23 2043 12/11/23 0420  AST 23 23  ALT 31 32  ALKPHOS 40 37*  BILITOT 0.9 1.0  PROT 6.1* 6.1*  ALBUMIN 3.8 3.6   No results for input(s): LIPASE, AMYLASE in the last 168 hours. No results for input(s): AMMONIA in the last 168 hours. Coagulation Profile: No results for input(s): INR, PROTIME in the last 168 hours. CBC: Recent Labs  Lab 12/10/23 2043 12/11/23 0420  WBC 8.2 6.6  NEUTROABS 6.4  --   HGB 14.0 14.3  HCT 37.3* 38.3*  MCV 89.0 89.3  PLT 332 297   Cardiac Enzymes: No results for input(s): CKTOTAL, CKMB, CKMBINDEX, TROPONINI in the last 168 hours. BNP: Invalid input(s): POCBNP CBG: Recent Labs  Lab 12/11/23 1642 12/11/23 2007 12/12/23 0013 12/12/23 0407 12/12/23 0724  GLUCAP 112* 120* 154* 98 86   HbA1C: Recent Labs    12/11/23 0852  HGBA1C 5.0   Urine analysis:    Component Value Date/Time   COLORURINE COLORLESS (A) 12/10/2023 2335   APPEARANCEUR CLEAR 12/10/2023 2335   LABSPEC 1.001 (L) 12/10/2023 2335   PHURINE 7.0 12/10/2023 2335   GLUCOSEU NEGATIVE 12/10/2023 2335   HGBUR NEGATIVE 12/10/2023 2335   BILIRUBINUR NEGATIVE 12/10/2023 2335   KETONESUR NEGATIVE 12/10/2023 2335   PROTEINUR NEGATIVE 12/10/2023 2335   NITRITE NEGATIVE 12/10/2023 2335   LEUKOCYTESUR NEGATIVE 12/10/2023 2335   Sepsis Labs: @LABRCNTIP (procalcitonin:4,lacticidven:4) ) Recent Results  (from the past 240 hours)  MRSA Next Gen by PCR, Nasal     Status: None   Collection Time: 12/11/23  1:00 AM   Specimen: Nasal Mucosa; Nasal Swab  Result Value Ref Range Status   MRSA by PCR Next Gen NOT DETECTED NOT DETECTED Final    Comment: (NOTE) The GeneXpert MRSA Assay (FDA approved for NASAL specimens only), is one component of a comprehensive MRSA colonization surveillance program. It is not intended to diagnose MRSA infection nor to guide or monitor treatment for MRSA infections. Test performance is not FDA approved in patients less than 47 years old. Performed at [facility], [address], [place], [place] [address]      Scheduled Meds:  buPROPion   450 mg Oral q  morning   Chlorhexidine  Gluconate Cloth  6 each Topical Daily   [START ON 12/13/2023] cosyntropin  0.25 mg Intravenous Once   DULoxetine   120 mg Oral Daily   enoxaparin  (LOVENOX ) injection  40 mg Subcutaneous Q24H   hydrALAZINE   50 mg Oral TID   losartan   100 mg Oral Daily   propranolol   20 mg Oral Daily   sodium chloride   1 g Oral BID WC   Continuous Infusions:  Procedures/Studies: No results found.  Demaris Fillers, DO  [facility]  If 7PM-7AM, please contact night-coverage [email or web address] Password [facility] 12/12/2023, 10:27 AM   LOS: 2 days

## 2023-12-13 DIAGNOSIS — E871 Hypo-osmolality and hyponatremia: Secondary | ICD-10-CM | POA: Diagnosis not present

## 2023-12-13 DIAGNOSIS — I10 Essential (primary) hypertension: Secondary | ICD-10-CM | POA: Diagnosis not present

## 2023-12-13 DIAGNOSIS — E162 Hypoglycemia, unspecified: Secondary | ICD-10-CM | POA: Diagnosis not present

## 2023-12-13 LAB — BASIC METABOLIC PANEL WITH GFR
Anion gap: 6 (ref 5–15)
Anion gap: 9 (ref 5–15)
BUN: 11 mg/dL (ref 6–20)
BUN: 10 mg/dL (ref 6–20)
CO2: 27 mmol/L (ref 22–32)
CO2: 26 mmol/L (ref 22–32)
Calcium: 8.9 mg/dL (ref 8.9–10.3)
Calcium: 8.7 mg/dL — ABNORMAL LOW (ref 8.9–10.3)
Chloride: 89 mmol/L — ABNORMAL LOW (ref 98–111)
Chloride: 87 mmol/L — ABNORMAL LOW (ref 98–111)
Creatinine, Ser: 1.04 mg/dL (ref 0.61–1.24)
Creatinine, Ser: 0.95 mg/dL (ref 0.61–1.24)
GFR, Estimated: >60 mL/min (ref >60)
GFR, Estimated: >60 mL/min (ref >60)
Glucose, Bld: 94 mg/dL (ref 70–99)
Glucose, Bld: 123 mg/dL — ABNORMAL HIGH (ref 70–99)
Potassium: 5.4 mmol/L — ABNORMAL HIGH (ref 3.5–5.1)
Potassium: 4.6 mmol/L (ref 3.5–5.1)
Sodium: 122 mmol/L — ABNORMAL LOW (ref 135–145)
Sodium: 122 mmol/L — ABNORMAL LOW (ref 135–145)

## 2023-12-13 LAB — ACTH STIMULATION, 3 TIME POINTS
Cortisol, 30 Min: 16.4 ug/dL
Cortisol, 60 Min: 25.1 ug/dL
Cortisol, Base: 5.5 ug/dL

## 2023-12-13 LAB — GLUCOSE, CAPILLARY
Glucose-Capillary: 100 mg/dL — ABNORMAL HIGH (ref 70–99)
Glucose-Capillary: 101 mg/dL — ABNORMAL HIGH (ref 70–99)
Glucose-Capillary: 113 mg/dL — ABNORMAL HIGH (ref 70–99)
Glucose-Capillary: 114 mg/dL — ABNORMAL HIGH (ref 70–99)
Glucose-Capillary: 92 mg/dL (ref 70–99)
Glucose-Capillary: 95 mg/dL (ref 70–99)

## 2023-12-13 NOTE — Plan of Care (Signed)
   Problem: Education: Goal: Knowledge of General Education information will improve Description: Including pain rating scale, medication(s)/side effects and non-pharmacologic comfort measures Outcome: Progressing   Problem: Clinical Measurements: Goal: Ability to maintain clinical measurements within normal limits will improve Outcome: Progressing

## 2023-12-13 NOTE — Plan of Care (Signed)

## 2023-12-13 NOTE — Progress Notes (Signed)
 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  The patient was noted to have a sodium 121 with glucose 35 on his outpatient labs.  He was directed to come to the emergency department. Notably, the patient had low sodium his labs and was directed to go to the ED 10/17/23  At that time, the patient was discharged home from the ED in stable condition.  Notably, the patient states that he drinks 4 x 40 ounces of water in addition to 4 x 24 ounces of water in a different cup daily.  He states that he had been instructed to take electrolytes in the past, so he used Propel for the past week but ran out about 5 days prior to this admission. Review of the medical record shows that the patient has been struggling with low sodiums for at least the last 3 years, but they have worsened since March 2025 now with Na <125. He has for the most part asymptomatic.  However, he stated that yesterday he was feeling dizzy after he left his PCP office. He denies any new medications.  Patient denies fevers, chills, headache, chest pain, dyspnea, nausea, vomiting, diarrhea, abdominal pain, dysuria.  He states that he essentially only eats 1 meal per day.  He states that he has been doing that for a number of years.  In the ED, the patient had low-grade temperature 99.2 F.  He is hemodynamically stable with oxygen saturation 97-100% on room air.  Sodium 116, potassium 4.1, bicarbonate 22, serum creatinine 0.89.  WBC 8.2, hemoglobin 14.0, platelets 332.  The patient was given a 500 cc normal saline bolus and started on normal saline at 80 cc/h.     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   PTSD -Continue Wellbutrin  and Cymbalta    Hypoglycemia -Review of the record shows that this is also been a recurrent issue for him as well -Likely secondary to poor oral intake -Check A1c 5.0 -TSH-- 4.549, Free T4--0.98 -random cortisol 3.8   Essential hypertension - Continue hydralazine   - Hold losartan  temporarily and monitor   Migraine headache -continue propranolol    Low Cortisol -cortrosyn stimuation test--results pending   Family Communication:  no Family at bedside   Consultants:  renal   Code Status:  FULL   DVT Prophylaxis:  [place] Heparin / [place] Lovenox      Procedures: As Listed in Progress Note Above   Antibiotics: None       Subjective: Patient denies fevers, chills, dizziness, headache, chest pain, dyspnea, nausea, vomiting, diarrhea, abdominal pain, dysuria, hematuria, hematochezia, and melena.   Objective: Vitals:   12/12/23 1949 12/12/23 2033 12/13/23 0504 12/13/23 1302  BP: (!) 145/111 Aaron Aas)  150/105 (!) 135/96 131/89  Pulse: 80 79 85 87  Resp: 18  18 16   Temp: 98.5 F (36.9 C)  97.8 F (36.6 C) 98.7 F (37.1 C)  TempSrc: Oral  Oral Oral  SpO2:   99% 100%  Weight:      Height:        Intake/Output Summary (Last 24 hours) at 12/13/2023 1317 Last data filed at 12/13/2023 0500 Gross per 24 hour  Intake 360 ml  Output --  Net 360 ml   Weight change:  Exam:  General:  Pt is alert, follows commands appropriately, not in acute  distress HEENT: No icterus, No thrush, No neck mass, /AT Cardiovascular: RRR, S1/S2, no rubs, no gallops Respiratory: CTA bilaterally, no wheezing, no crackles, no rhonchi Abdomen: Soft/+BS, non tender, non distended, no guarding Extremities: No edema, No lymphangitis, No petechiae, No rashes, no synovitis   Data Reviewed: I have personally reviewed following labs and imaging studies Basic Metabolic Panel: Recent Labs  Lab 12/11/23 0420 12/11/23 0833 12/12/23 0749 12/13/23 0534 12/13/23 0855  NA 124* 126* 124* 122* 122*  K 4.4 4.2 3.9 5.4* 4.6  CL 93* 94* 88* 89* 87*  CO2 24 24 25 27 26   GLUCOSE 89 107* 103* 94 123*  BUN 11 10 9 11 10   CREATININE 0.80 0.89 0.87 1.04 0.95  CALCIUM 8.5* 8.8* 8.9 8.9 8.7*  MG 1.9  --   --   --   --   PHOS 2.5  --   --   --   --    Liver Function Tests: Recent Labs  Lab 12/10/23 2043 12/11/23 0420  AST 23 23  ALT 31 32  ALKPHOS 40 37*  BILITOT 0.9 1.0  PROT 6.1* 6.1*  ALBUMIN 3.8 3.6   No results for input(s): LIPASE, AMYLASE in the last 168 hours. No results for input(s): AMMONIA in the last 168 hours. Coagulation Profile: No results for input(s): INR, PROTIME in the last 168 hours. CBC: Recent Labs  Lab 12/10/23 2043 12/11/23 0420  WBC 8.2 6.6  NEUTROABS 6.4  --   HGB 14.0 14.3  HCT 37.3* 38.3*  MCV 89.0 89.3  PLT 332 297   Cardiac Enzymes: No results for input(s): CKTOTAL, CKMB, CKMBINDEX, TROPONINI in the last 168 hours. BNP: Invalid input(s): POCBNP CBG: Recent Labs  Lab 12/12/23 1951 12/13/23 0035 12/13/23 0439 12/13/23 0746 12/13/23 1154  GLUCAP 89 114* 113* 92 95   HbA1C: Recent Labs    12/11/23 0852  HGBA1C 5.0   Urine analysis:    Component Value Date/Time   COLORURINE COLORLESS (A) 12/10/2023 2335   APPEARANCEUR CLEAR 12/10/2023 2335   LABSPEC 1.001 (L) 12/10/2023 2335   PHURINE 7.0 12/10/2023 2335   GLUCOSEU NEGATIVE 12/10/2023 2335   HGBUR NEGATIVE 12/10/2023 2335    BILIRUBINUR NEGATIVE 12/10/2023 2335   KETONESUR NEGATIVE 12/10/2023 2335   PROTEINUR NEGATIVE 12/10/2023 2335   NITRITE NEGATIVE 12/10/2023 2335   LEUKOCYTESUR NEGATIVE 12/10/2023 2335   Sepsis Labs: @LABRCNTIP (procalcitonin:4,lacticidven:4) ) Recent Results (from the past 240 hours)  MRSA Next Gen by PCR, Nasal     Status: None   Collection Time: 12/11/23  1:00 AM   Specimen: Nasal Mucosa; Nasal Swab  Result Value Ref Range Status   MRSA by PCR Next Gen NOT DETECTED NOT DETECTED Final    Comment: (NOTE) The GeneXpert MRSA Assay (FDA approved for NASAL specimens only), is one component of a comprehensive MRSA colonization surveillance program. It is not intended to diagnose  MRSA infection nor to guide or monitor treatment for MRSA infections. Test performance is not FDA approved in patients less than 66 years old. Performed at [facility], [address], [place], [place] [address]      Scheduled Meds:  buPROPion   450 mg Oral q morning   DULoxetine   120 mg Oral Daily   enoxaparin  (LOVENOX ) injection  40 mg Subcutaneous Q24H   hydrALAZINE   50 mg Oral TID   propranolol   20 mg Oral Daily   sodium chloride   1 g Oral BID WC   Continuous Infusions:  Procedures/Studies: No results found.  Demaris Fillers, DO  [facility]  If 7PM-7AM, please contact night-coverage [email or web address] Password [email or web address] 12/13/2023, 1:17 PM   LOS: 3 days

## 2023-12-14 DIAGNOSIS — F418 Other specified anxiety disorders: Secondary | ICD-10-CM | POA: Diagnosis not present

## 2023-12-14 DIAGNOSIS — E871 Hypo-osmolality and hyponatremia: Secondary | ICD-10-CM | POA: Diagnosis not present

## 2023-12-14 DIAGNOSIS — I10 Essential (primary) hypertension: Secondary | ICD-10-CM | POA: Diagnosis not present

## 2023-12-14 LAB — GLUCOSE, CAPILLARY
Glucose-Capillary: 106 mg/dL — ABNORMAL HIGH (ref 70–99)
Glucose-Capillary: 108 mg/dL — ABNORMAL HIGH (ref 70–99)
Glucose-Capillary: 113 mg/dL — ABNORMAL HIGH (ref 70–99)
Glucose-Capillary: 93 mg/dL (ref 70–99)
Glucose-Capillary: 94 mg/dL (ref 70–99)
Glucose-Capillary: 95 mg/dL (ref 70–99)
Glucose-Capillary: 96 mg/dL (ref 70–99)

## 2023-12-14 LAB — BASIC METABOLIC PANEL WITH GFR
Anion gap: 10 (ref 5–15)
BUN: 12 mg/dL (ref 6–20)
CO2: 24 mmol/L (ref 22–32)
Calcium: 8.8 mg/dL — ABNORMAL LOW (ref 8.9–10.3)
Chloride: 89 mmol/L — ABNORMAL LOW (ref 98–111)
Creatinine, Ser: 0.91 mg/dL (ref 0.61–1.24)
GFR, Estimated: >60 mL/min (ref >60)
Glucose, Bld: 95 mg/dL (ref 70–99)
Potassium: 5.4 mmol/L — ABNORMAL HIGH (ref 3.5–5.1)
Sodium: 123 mmol/L — ABNORMAL LOW (ref 135–145)

## 2023-12-14 LAB — MAGNESIUM: Magnesium: 2.1 mg/dL (ref 1.7–2.4)

## 2023-12-14 MED ORDER — SODIUM ZIRCONIUM CYCLOSILICATE 5 G PO PACK
5.0000 g | PACK | Freq: Once | ORAL | Status: AC
  Administered 2023-12-14: 5 g via ORAL
  Filled 2023-12-14: qty 1

## 2023-12-14 NOTE — Progress Notes (Signed)
 Patient ambulated with his service dog. Pleasant, took meds without any difficulty. Call bell with in reach. Steady gait.

## 2023-12-14 NOTE — Plan of Care (Signed)

## 2023-12-14 NOTE — Progress Notes (Signed)
 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  The patient was noted to have a sodium 121 with glucose 35 on his outpatient labs.  He was directed to come to the emergency department. Notably, the patient had low sodium his labs and was directed to go to the ED 10/17/23  At that time, the patient was discharged home from the ED in stable condition.  Notably, the patient states that he drinks 4 x 40 ounces of water in addition to 4 x 24 ounces of water in a different cup daily.  He states that he had been instructed to take electrolytes in the past, so he used Propel for the past week but ran out about 5 days prior to this admission. Review of the medical record shows that the patient has been struggling with low sodiums for at least the last 3 years, but they have worsened since March 2025 now with Na <125. He has for the most part asymptomatic.  However, he stated that yesterday he was feeling dizzy after he left his PCP office. He denies any new medications.  Patient denies fevers, chills, headache, chest pain, dyspnea, nausea, vomiting, diarrhea, abdominal pain, dysuria.  He states that he essentially only eats 1 meal per day.  He states that he has been doing that for a number of years.  In the ED, the patient had low-grade temperature 99.2 F.  He is hemodynamically stable with oxygen saturation 97-100% on room air.  Sodium 116, potassium 4.1, bicarbonate 22, serum creatinine 0.89.  WBC 8.2, hemoglobin 14.0, platelets 332.  The patient was given a 500 cc normal saline bolus and started on normal saline at 80 cc/h.     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 -cortrosyn stimulation test normal (5.5>>16.4>>25.1)   PTSD -Continue Wellbutrin  and Cymbalta    Hypoglycemia -Review of the record shows that this is also been a recurrent issue for him as well -Likely secondary to poor oral intake -Check A1c 5.0 -TSH-- 4.549, Free T4--0.98 -random cortisol 3.8 -cortrosyn stimulation test normal (5.5>>16.4>>25.1)   Essential hypertension - Continue hydralazine   - Hold losartan  temporarily and monitor   Migraine headache -continue propranolol    Low Cortisol -cortrosyn stimulation test normal (5.5>>16.4>>25.1)  Hyperkalemia -lokelma x 1     Family Communication:  no Family at bedside   Consultants:  renal   Code Status:  FULL   DVT Prophylaxis:  [place] Lovenox      Procedures: As Listed in Progress Note Above   Antibiotics: None             Subjective: Patient denies fevers, chills, headache, chest pain, dyspnea, nausea, vomiting, diarrhea, abdominal pain, dysuria, hematuria,  hematochezia, and melena.   Objective: Vitals:   12/13/23 1302 12/13/23 1947 12/14/23 0354 12/14/23 1346  BP: 131/89 (!) 144/97 (!) 149/89 130/86  Pulse: 87 89 86 91  Resp: 16 20 18 16   Temp: 98.7 F (37.1 C) 98.4 F (36.9 C) 97.8 F (36.6 C) 98.4 F (36.9 C)  TempSrc: Oral Oral Oral Oral  SpO2: 100% 98% 98% 97%  Weight:      Height:       No intake or output data in the 24 hours ending 12/14/23 1632 Weight change:   Exam:  General:  Pt is alert, follows commands appropriately, not in acute distress HEENT: No icterus, No thrush, No neck mass, [place]/AT Cardiovascular: RRR, S1/S2, no rubs, no gallops Respiratory: CTA bilaterally, no wheezing, no crackles, no rhonchi Abdomen: Soft/+BS, non tender, non distended, no guarding Extremities: No edema, No lymphangitis, No petechiae, No rashes, no synovitis   Data Reviewed: I have personally reviewed following labs and imaging studies Basic Metabolic Panel: Recent Labs  Lab 12/11/23 0420 12/11/23 0833 12/12/23 0749 12/13/23 0534 12/13/23 0855 12/14/23 0252  NA 124* 126* 124* 122* 122* 123*  K 4.4 4.2 3.9 5.4* 4.6 5.4*  CL 93* 94* 88* 89* 87* 89*  CO2 24 24 25 27 26 24   GLUCOSE 89 107* 103* 94 123* 95  BUN 11 10 9 11 10 12   CREATININE 0.80 0.89 0.87 1.04 0.95 0.91  CALCIUM 8.5* 8.8* 8.9 8.9 8.7* 8.8*  MG 1.9  --   --   --   --  2.1  PHOS 2.5  --   --   --   --   --    Liver Function Tests: Recent Labs  Lab 12/10/23 2043 12/11/23 0420  AST 23 23  ALT 31 32  ALKPHOS 40 37*  BILITOT 0.9 1.0  PROT 6.1* 6.1*  ALBUMIN 3.8 3.6   No results for input(s): LIPASE, AMYLASE in the last 168 hours. No results for input(s): AMMONIA in the last 168 hours. Coagulation Profile: No results for input(s): INR, PROTIME in the last 168 hours. CBC: Recent Labs  Lab 12/10/23 2043 12/11/23 0420  WBC 8.2 6.6  NEUTROABS 6.4  --   HGB 14.0 14.3  HCT 37.3* 38.3*  MCV 89.0 89.3  PLT 332 297   Cardiac Enzymes: No results for input(s): CKTOTAL, CKMB, CKMBINDEX, TROPONINI in the last 168 hours. BNP: Invalid input(s): POCBNP CBG: Recent Labs  Lab 12/14/23 0003 12/14/23 0352 12/14/23 0728 12/14/23 1141 12/14/23 1553  GLUCAP 113* 93 95 96 106*   HbA1C: No results for input(s): HGBA1C in the last 72 hours. Urine analysis:    Component Value Date/Time   COLORURINE COLORLESS (A) 12/10/2023 2335   APPEARANCEUR CLEAR 12/10/2023  2335   LABSPEC 1.001 (L) 12/10/2023 2335   PHURINE 7.0 12/10/2023 2335   GLUCOSEU NEGATIVE 12/10/2023 2335   HGBUR NEGATIVE 12/10/2023 2335   BILIRUBINUR NEGATIVE 12/10/2023 2335   KETONESUR NEGATIVE 12/10/2023 2335   PROTEINUR NEGATIVE 12/10/2023 2335   NITRITE NEGATIVE 12/10/2023 2335   LEUKOCYTESUR NEGATIVE 12/10/2023 2335   Sepsis Labs: @LABRCNTIP (procalcitonin:4,lacticidven:4) ) Recent Results (from the past 240 hours)  MRSA Next Gen by PCR, Nasal     Status: None   Collection Time: 12/11/23  1:00 AM   Specimen: Nasal Mucosa; Nasal Swab  Result Value Ref Range Status   MRSA by PCR Next Gen NOT DETECTED NOT DETECTED Final    Comment: (NOTE) The GeneXpert MRSA Assay (FDA approved for NASAL specimens only),  is one component of a comprehensive MRSA colonization surveillance program. It is not intended to diagnose MRSA infection nor to guide or monitor treatment for MRSA infections. Test performance is not FDA approved in patients less than 73 years old. Performed at [facility], [address], [place], [place] [address]      Scheduled Meds:  buPROPion   450 mg Oral q morning   DULoxetine   120 mg Oral Daily   enoxaparin  (LOVENOX ) injection  40 mg Subcutaneous Q24H   hydrALAZINE   50 mg Oral TID   propranolol   20 mg Oral Daily   sodium chloride   1 g Oral BID WC   sodium zirconium cyclosilicate  5 g Oral Once   Continuous Infusions:  Procedures/Studies: No results found.  Demaris Fillers, DO  [facility]  If 7PM-7AM, please contact night-coverage [email or web address] Password [email or web address] 12/14/2023, 4:32 PM   LOS: 4 days

## 2023-12-15 DIAGNOSIS — E871 Hypo-osmolality and hyponatremia: Secondary | ICD-10-CM | POA: Diagnosis not present

## 2023-12-15 DIAGNOSIS — F418 Other specified anxiety disorders: Secondary | ICD-10-CM | POA: Diagnosis not present

## 2023-12-15 LAB — GLUCOSE, CAPILLARY
Glucose-Capillary: 91 mg/dL (ref 70–99)
Glucose-Capillary: 90 mg/dL (ref 70–99)

## 2023-12-15 LAB — BASIC METABOLIC PANEL WITH GFR
Anion gap: 9 (ref 5–15)
BUN: 8 mg/dL (ref 6–20)
CO2: 22 mmol/L (ref 22–32)
Calcium: 8.5 mg/dL — ABNORMAL LOW (ref 8.9–10.3)
Chloride: 93 mmol/L — ABNORMAL LOW (ref 98–111)
Creatinine, Ser: 0.79 mg/dL (ref 0.61–1.24)
GFR, Estimated: >60 mL/min (ref >60)
Glucose, Bld: 88 mg/dL (ref 70–99)
Potassium: 3.9 mmol/L (ref 3.5–5.1)
Sodium: 124 mmol/L — ABNORMAL LOW (ref 135–145)

## 2023-12-15 MED ORDER — SODIUM CHLORIDE 1 G PO TABS: 1.0000 g | ORAL_TABLET | Freq: Three times a day (TID) | ORAL | 1 refills | Status: AC

## 2023-12-15 MED ORDER — HYDRALAZINE HCL 50 MG PO TABS: 50.0000 mg | ORAL_TABLET | Freq: Three times a day (TID) | ORAL | 1 refills | Status: AC

## 2023-12-15 MED ORDER — FUROSEMIDE 20 MG PO TABS
10.0000 mg | ORAL_TABLET | Freq: Every day | ORAL | Status: DC
  Administered 2023-12-15: 10 mg via ORAL
  Filled 2023-12-15: qty 1

## 2023-12-15 MED ORDER — FUROSEMIDE 20 MG PO TABS: 10.0000 mg | ORAL_TABLET | Freq: Every day | ORAL | 1 refills | Status: AC

## 2023-12-15 MED ORDER — SODIUM CHLORIDE 1 G PO TABS: 1.0000 g | ORAL_TABLET | Freq: Three times a day (TID) | ORAL | Status: DC

## 2023-12-15 NOTE — Progress Notes (Signed)
   12/15/23 0428  Vitals  Temp 97.8 F (36.6 C)  Temp Source Axillary  BP (!) 134/106  MAP (mmHg) 115  Patient Position (if appropriate) Lying  Pulse Rate 84  Pulse Rate Source Dinamap  Resp 18  Level of Consciousness  Level of Consciousness Alert  MEWS COLOR  MEWS Score Color Green  Oxygen Therapy  SpO2 100 %  O2 Device Room Air  MEWS Score  MEWS Temp 0  MEWS Systolic 0  MEWS Pulse 0  MEWS RR 0  MEWS LOC 0  MEWS Score 0   Dr. Elyse Hand made aware of BP, no new orders, patient asymptomatic

## 2023-12-15 NOTE — Plan of Care (Signed)

## 2023-12-15 NOTE — Discharge Summary (Signed)
 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  The patient was noted to have a sodium 121 with glucose 35 on his outpatient labs.  He was directed to come to the emergency department. Notably, the patient had low sodium his labs and was directed to go to the ED 10/17/23  At that time, the patient was discharged home from the ED in stable condition.  Notably, the patient states that he drinks 4 x 40 ounces of water in addition to 4 x 24 ounces of water in a different cup daily.  He states that he had been instructed to take electrolytes in the past, so he used Propel for the past week but ran out about 5 days prior to this admission. Review of the medical record shows that the patient has been struggling with low sodiums for at least the last 3 years, but they have worsened since March 2025 now with Na <125. He has for the most part asymptomatic.  However, he stated that yesterday he was feeling dizzy after he left his PCP office. He denies any new medications.  Patient denies fevers, chills, headache, chest pain, dyspnea, nausea, vomiting, diarrhea, abdominal pain, dysuria.  He states that he essentially only eats 1 meal per day.  He states that he has been doing that for a number of years.  In the ED, the patient had low-grade temperature 99.2 F.  He is hemodynamically stable with oxygen saturation 97-100% on room air.  Sodium 116, potassium 4.1, bicarbonate 22, serum creatinine 0.89.  WBC  8.2, hemoglobin 14.0, platelets 332.  The patient was given a 500 cc normal saline bolus and started on normal saline at 80 cc/h.  Nephrology was consulted and patient was started on NaCl tabs with slow improvement of Na.  His free water intake was somewhat restricted during the hospitalization.  Lasix 10 mg po daily was added.  The patient remained asymptomatic.  It was felt patient has a reset renal osmostat with baseline serum Na 124-126.  He was d/ced with instructions to f/u with his PCP for repeat Na in 3-4 days.   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 although there is contribution from his poor solute intake and meds (cymbalta  and wellbutrin ) - Serum osmolarity 258 - Urine osmolarity 132 - Urine sodium 27 - Patient does not desire to cut back on his free water intake - Nephrology consult appreciated - Certainly, his Wellbutrin  and Cymbalta  may be contributing, but he has been on these medicines for a number of years -6/15-- Na = 123>>> continue current therapy with sodium chloride  tablets and instructed patient to decrease free water intake  -cortrosyn stimulation test normal (5.5>>16.4>>25.1) -d/c home with NaCl TID and lasix 10 mg daily   PTSD -Continue Wellbutrin  and Cymbalta    Hypoglycemia -Review of the record shows that this is also been a recurrent issue for him as well -secondary to poor oral intake -Check A1c 5.0 -TSH-- 4.549,  Free T4--0.98 -random cortisol 3.8 -cortrosyn stimulation test normal (5.5>>16.4>>25.1)   Essential hypertension - Continue hydralazine   - Hold losartan  temporarily and monitor   Migraine headache -continue propranolol    Low Cortisol -cortrosyn stimulation test normal (5.5>>16.4>>25.1)   Hyperkalemia -lokelma x 1 -improved -d/c losartan         Consultants: renal Procedures performed: none  Disposition: Home Diet recommendation:  Regular diet DISCHARGE  MEDICATION: Allergies as of 12/15/2023       Reactions   Amlodipine Swelling   Leg edema   Anesthesia S-i-[id number] [propofol] Other (See Comments)   Possible MH, temp of 105  Unsure of which anesthetics specifically caused that        Medication List     STOP taking these medications    losartan  100 MG tablet Commonly known as: COZAAR        TAKE these medications    buPROPion  300 MG 24 hr tablet Commonly known as: WELLBUTRIN  XL Take 1 tablet by mouth every morning. Take alongside 150 mg for total of 450 mg   buPROPion  150 MG 24 hr tablet Commonly known as: WELLBUTRIN  XL Take 1 tablet by mouth every morning. Take alongside 300 mg for total of 450 mg   DULoxetine  60 MG capsule Commonly known as: CYMBALTA  Take 120 mg by mouth daily.   furosemide 20 MG tablet Commonly known as: LASIX Take 0.5 tablets (10 mg total) by mouth daily.   hydrALAZINE  50 MG tablet Commonly known as: APRESOLINE  Take 1 tablet (50 mg total) by mouth 3 (three) times daily. What changed: when to take this   propranolol  10 MG tablet Commonly known as: INDERAL  Take 20 mg by mouth daily.   sodium chloride  1 g tablet Take 1 tablet (1 g total) by mouth 3 (three) times daily with meals.        Discharge Exam: Filed Weights   12/10/23 2014  Weight: 79.4 kg   HEENT:  [place]/AT, No thrush, no icterus CV:  RRR, no rub, no S3, no S4 Lung:  CTA, no wheeze, no rhonchi Abd:  soft/+BS, NT Ext:  No edema, no lymphangitis, no synovitis, no rash Neuro:  CN II-XII intact, strength 4/5 in RUE, RLE, strength 4/5 LUE, LLE; sensation intact bilateral; no dysmetria; babinski equivocal   Condition at discharge: stable  The results of significant diagnostics from this hospitalization (including imaging, microbiology, ancillary and laboratory) are listed below for reference.   Imaging Studies: No results found.  Microbiology: Results for orders placed or performed during the hospital encounter of 12/10/23   MRSA Next Gen by PCR, Nasal     Status: None   Collection Time: 12/11/23  1:00 AM   Specimen: Nasal Mucosa; Nasal Swab  Result Value Ref Range Status   MRSA by PCR Next Gen NOT DETECTED NOT DETECTED Final    Comment: (NOTE) The GeneXpert MRSA Assay (FDA approved for NASAL specimens only), is one component of a comprehensive MRSA colonization surveillance program. It is not intended to diagnose MRSA infection nor to guide or monitor treatment for MRSA infections. Test performance is not FDA approved in patients less than 11 years old. Performed at [facility], [address], [place], [place] [address]     Labs: CBC: Recent Labs  Lab 12/10/23 2043 12/11/23 0420  WBC 8.2 6.6  NEUTROABS 6.4  --   HGB 14.0 14.3  HCT 37.3* 38.3*  MCV 89.0 89.3  PLT 332 297   Basic Metabolic Panel: Recent Labs  Lab 12/11/23 0420 12/11/23  4010 12/12/23 0749 12/13/23 0534 12/13/23 0855 12/14/23 0252 12/15/23 0452  NA 124*   < > 124* 122* 122* 123* 124*  K 4.4   < > 3.9 5.4* 4.6 5.4* 3.9  CL 93*   < > 88* 89* 87* 89* 93*  CO2 24   < > 25 27 26 24 22   GLUCOSE 89   < > 103* 94 123* 95 88  BUN 11   < > 9 11 10 12 8   CREATININE 0.80   < > 0.87 1.04 0.95 0.91 0.79  CALCIUM 8.5*   < > 8.9 8.9 8.7* 8.8* 8.5*  MG 1.9  --   --   --   --  2.1  --   PHOS 2.5  --   --   --   --   --   --    < > = values in this interval not displayed.   Liver Function Tests: Recent Labs  Lab 12/10/23 2043 12/11/23 0420  AST 23 23  ALT 31 32  ALKPHOS 40 37*  BILITOT 0.9 1.0  PROT 6.1* 6.1*  ALBUMIN 3.8 3.6   CBG: Recent Labs  Lab 12/14/23 1553 12/14/23 2009 12/14/23 2353 12/15/23 0427 12/15/23 0720  GLUCAP 106* 94 108* 91 90    Discharge time spent: greater than 30 minutes.  Signed: Demaris Fillers, MD [facility] 12/15/2023

## 2023-12-15 NOTE — Progress Notes (Signed)
 Patient ID: Jerome Lopez, male   DOB: 03-Feb-1971, 53 y.o.   MRN: [id number] [facility] Progress Note   Assessment/ Plan:   1.  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  Cosyntropin stim test negative for adrenal insufficiency.  Sodium level remains in flux after starting oral sodium chloride  and on low-dose loop diuretic to help with additional blood pressure and sodium control. 2.  Hypertension: Blood pressures elevated off of ARB therapy (transiently held to help with correction of hyponatremia).  Monitor with initiation of diuretic and restart ARB with uptrending sodium. 3.  Squamous cell carcinoma of left tonsil: Status post tonsillectomy and XRT. 4.  Anxiety/post-traumatic stress disorder: Continue Wellbutrin  and Cymbalta .  Subjective:   Reports to be feeling fair, denies any chest pain or shortness of breath.   Objective:   BP (!) 134/106   Pulse 84   Temp 97.8 F (36.6 C) (Axillary)   Resp 18   Ht 5' 10 (1.778 m)   Wt 79.4 kg   SpO2 100%   BMI 25.11 kg/m  No intake or output data in the 24 hours ending 12/15/23 0824 Weight change:   Physical Exam: Gen: Comfortably sleeping in bed.  Service dog Associate Professor) at bedside CVS: Pulse regular rhythm, normal rate, S1 and S2 normal Resp: Clear to auscultation, no rales/rhonchi Abd: Soft, flat, nontender, bowel sounds normal Ext: Trace ankle edema  Imaging: No results found.  Labs: BMET Recent Labs  Lab 12/11/23 0420 12/11/23 4098 12/12/23 0749 12/13/23 0534 12/13/23 0855 12/14/23 0252 12/15/23 0452  NA 124* 126* 124* 122* 122* 123* 124*  K 4.4 4.2 3.9 5.4* 4.6 5.4* 3.9  CL 93* 94* 88* 89* 87* 89* 93*  CO2 24 24 25 27 26 24 22   GLUCOSE 89 107* 103* 94 123* 95 88  BUN 11 10 9 11 10 12 8   CREATININE 0.80 0.89 0.87 1.04 0.95 0.91 0.79  CALCIUM 8.5* 8.8* 8.9 8.9 8.7* 8.8* 8.5*  PHOS 2.5  --   --   --    --   --   --    CBC Recent Labs  Lab 12/10/23 2043 12/11/23 0420  WBC 8.2 6.6  NEUTROABS 6.4  --   HGB 14.0 14.3  HCT 37.3* 38.3*  MCV 89.0 89.3  PLT 332 297    Medications:     buPROPion   450 mg Oral q morning   DULoxetine   120 mg Oral Daily   enoxaparin  (LOVENOX ) injection  40 mg Subcutaneous Q24H   hydrALAZINE   50 mg Oral TID   propranolol   20 mg Oral Daily   sodium chloride   1 g Oral BID WC    Clevester Dally, MD 12/15/2023, 8:24 AM

## 2024-05-18 ENCOUNTER — Encounter: Payer: Self-pay | Admitting: Family Medicine

## 2024-05-18 ENCOUNTER — Other Ambulatory Visit: Payer: Self-pay | Admitting: Family Medicine

## 2024-05-18 DIAGNOSIS — K469 Unspecified abdominal hernia without obstruction or gangrene: Secondary | ICD-10-CM

## 2024-05-21 ENCOUNTER — Encounter: Payer: Self-pay | Admitting: Family Medicine

## 2024-05-24 ENCOUNTER — Ambulatory Visit
Admission: RE | Admit: 2024-05-24 | Discharge: 2024-05-24 | Disposition: A | Source: Ambulatory Visit | Attending: Family Medicine | Admitting: Family Medicine

## 2024-05-24 DIAGNOSIS — K469 Unspecified abdominal hernia without obstruction or gangrene: Secondary | ICD-10-CM
